# Patient Record
Sex: Male | Born: 1960 | Race: White | Hispanic: No | Marital: Married | Smoking: Former smoker
Health system: Southern US, Community
[De-identification: ages and names within clinical notes are randomized; demographics above are authoritative.]

## PROBLEM LIST (undated history)

## (undated) DIAGNOSIS — K439 Ventral hernia without obstruction or gangrene: Secondary | ICD-10-CM

## (undated) DIAGNOSIS — C689 Malignant neoplasm of urinary organ, unspecified: Secondary | ICD-10-CM

## (undated) DIAGNOSIS — Z8719 Personal history of other diseases of the digestive system: Secondary | ICD-10-CM

## (undated) DIAGNOSIS — R000 Tachycardia, unspecified: Secondary | ICD-10-CM

## (undated) DIAGNOSIS — M1990 Unspecified osteoarthritis, unspecified site: Secondary | ICD-10-CM

## (undated) DIAGNOSIS — N329 Bladder disorder, unspecified: Secondary | ICD-10-CM

## (undated) DIAGNOSIS — K8020 Calculus of gallbladder without cholecystitis without obstruction: Secondary | ICD-10-CM

## (undated) DIAGNOSIS — F419 Anxiety disorder, unspecified: Secondary | ICD-10-CM

## (undated) DIAGNOSIS — C7951 Secondary malignant neoplasm of bone: Secondary | ICD-10-CM

## (undated) DIAGNOSIS — R319 Hematuria, unspecified: Secondary | ICD-10-CM

## (undated) DIAGNOSIS — K5730 Diverticulosis of large intestine without perforation or abscess without bleeding: Secondary | ICD-10-CM

## (undated) DIAGNOSIS — C679 Malignant neoplasm of bladder, unspecified: Secondary | ICD-10-CM

## (undated) DIAGNOSIS — C787 Secondary malignant neoplasm of liver and intrahepatic bile duct: Secondary | ICD-10-CM

## (undated) DIAGNOSIS — E785 Hyperlipidemia, unspecified: Secondary | ICD-10-CM

## (undated) HISTORY — PX: OTHER SURGICAL HISTORY: SHX169

## (undated) HISTORY — PX: APPENDECTOMY: SHX54

---

## 2000-07-02 HISTORY — PX: ABDOMINAL HERNIA REPAIR: SHX539

## 2005-10-27 ENCOUNTER — Emergency Department (HOSPITAL_COMMUNITY): Admission: EM | Admit: 2005-10-27 | Discharge: 2005-10-27 | Payer: Self-pay | Admitting: Emergency Medicine

## 2005-10-30 ENCOUNTER — Inpatient Hospital Stay (HOSPITAL_COMMUNITY): Admission: AD | Admit: 2005-10-30 | Discharge: 2005-11-05 | Payer: Self-pay | Admitting: Family Medicine

## 2005-10-31 ENCOUNTER — Ambulatory Visit: Payer: Self-pay | Admitting: Internal Medicine

## 2005-11-02 ENCOUNTER — Encounter (INDEPENDENT_AMBULATORY_CARE_PROVIDER_SITE_OTHER): Payer: Self-pay | Admitting: Specialist

## 2005-11-02 HISTORY — PX: OTHER SURGICAL HISTORY: SHX169

## 2007-07-21 ENCOUNTER — Ambulatory Visit (HOSPITAL_COMMUNITY): Admission: RE | Admit: 2007-07-21 | Discharge: 2007-07-21 | Payer: Self-pay | Admitting: Family Medicine

## 2007-08-01 ENCOUNTER — Ambulatory Visit (HOSPITAL_COMMUNITY): Admission: RE | Admit: 2007-08-01 | Discharge: 2007-08-01 | Payer: Self-pay | Admitting: Family Medicine

## 2010-11-17 NOTE — Discharge Summary (Signed)
NAME:  [name], [name] NO.:  [id number]   MEDICAL RECORD NO.:  [id number]          PATIENT TYPE:  INP   LOCATION:  A326                          FACILITY:  [facility]   PHYSICIAN:  Dalia Heading, M.D.  DATE OF BIRTH:  Nov 24, 1960   DATE OF ADMISSION:  10/30/2005  DATE OF DISCHARGE:  05/07/2007LH                                 DISCHARGE SUMMARY   HOSPITAL COURSE SUMMARY:  The patient is a 50 year old white male who was  admitted by Dr. Gareth Morgan for worsening abdominal distension, nausea,  vomiting.  A CT scan of the abdomen and pelvis done on in an outside  facility revealed a partial small-bowel obstruction.  Despite nasogastric  tube decompression, the patient's bowel obstruction did not resolve.  Both  gastroenterology and surgery were consulted.  Ultimately, the patient was  taken to the operating room on Nov 02, 2005 and underwent exploratory  laparotomy, lysis of adhesions and a Meckel's diverticulectomy.  He was  found to have previous adhesions encompassing the mid small bowel in the  right lower quadrant from a previous appendectomy.  He also was found to  have an incidental Meckel's diverticulum which was resected.  The patient  tolerated the procedure well.  The patient's postoperative course was for  the most part unremarkable.  The diet was advanced without difficulty once  his bowel function returned.   The patient is being discharged home on postoperative day #3 in fair and  stable condition.   DISCHARGE INSTRUCTIONS:  The patient is to follow up with Dr. Franky Macho  on Nov 12, 2005.   DISCHARGE MEDICATIONS:  Vicodin 1-2 tablets p.o. q.4 h p.r.n. pain.   PRINCIPAL DIAGNOSIS:  1.  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  2.  Meckel's diverticulum.   PRINCIPAL PROCEDURE:  Exploratory laparotomy, lysis of adhesions, Meckel's  diverticulectomy on Nov 02, 2005.      Dalia Heading, M.D.  Electronically Signed     MAJ/MEDQ  D:   11/05/2005  T:  11/06/2005  Job:  [id number]   cc:   Lionel December, M.D.  [address]  [place]  [place] [address]   Mila Homer Sudie Bailey, M.D.  Fax: [phone]

## 2010-11-17 NOTE — Op Note (Signed)
NAME:  ADEM, COSTLOW NO.:  [id number]   MEDICAL RECORD NO.:  [id number]          PATIENT TYPE:  INP   LOCATION:  A326                          FACILITY:  [facility]   PHYSICIAN:  Dalia Heading, M.D.  DATE OF BIRTH:  02-16-1961   DATE OF PROCEDURE:  11/02/2005  DATE OF DISCHARGE:                                 OPERATIVE REPORT   AGE:  50 years.   PREOPERATIVE DIAGNOSIS:  Small bowel obstruction.   POSTOPERATIVE DIAGNOSES:  1.  Small bowel obstruction.  2.  Adhesive disease.  3.  Meckel's diverticulum.   PROCEDURES:  1.  Exploratory laparotomy.  2.  Lysis of adhesions.  3.  Meckel's diverticulectomy.   SURGEON:  Dr. Franky Macho.   ANESTHESIA:  General endotracheal.   INDICATIONS:  The patient is a 50 year old white male who presents with  abdominal distension.  X-rays reveal this to be a small bowel obstruction.  He has not improved with nasogastric tube decompression, thus the patient is  coming to the operating room for an exploratory laparotomy.  The risks and  benefits of the procedure, including bleeding, infection, and  cardiopulmonary difficulties, were fully explained to the patient who gave  informed consent.   PROCEDURE NOTE:  The patient was placed in the supine position.  After  induction of general endotracheal anesthesia, the abdomen was prepped and  draped using the usual sterile technique with Betadine.  Surgical site  confirmation was performed.   A midline incision was made around the umbilicus.  The peritoneal cavity was  entered into without difficulty.  The patient was noted to have thickened,  proximal small bowel wall.  This was also noted to be dilated.  This was  taken down to an area in the right lower quadrant region.  Omentum was noted  to be stuck in this region, causing some of the obstruction.  In addition,  the mesentery in this defect appeared to be somewhat strictured down  secondary to adhesions.  These were freed  away without difficulty.  The  omentum was divided without difficulty.  Distal to this, collapsed small  bowel was noted.  On further inspection towards the terminal ileum, a  Meckel's diverticulum was found.  This was excised using a TA-30 stapler.  The staple line was bolstered using 3-0 silk sutures.  The specimen sent to  pathology for further examination.  The small bowel was then inspected from  the ligament of Treitz to the terminal ileum.  No other obstructive lesions  were noted.  The rest the abdominal examination was within normal limits.  The patient did not have an appendix.  The ascending colon, transverse  colon, descending colon regions were within normal limits.  No abnormal  lesions were noted.  The nasogastric tube was noted be in the appropriate  position in the stomach.  The liver was inspected, noted to within normal  limits.  The bowels were returned into the abdominal cavity in orderly  fashion, after the peritoneal cavity was irrigated normal saline.  The  fascia was reapproximated using looped 0 Novofil  running suture.  Subcutaneous layer was irrigated normal saline and skin was closed using  staples.  Betadine ointment and dry sterile dressing were applied.   All tape and needle counts were correct at the end of the procedure.  The  patient was extubated in the operating room and went back to recovery room,  awake, in stable condition.   COMPLICATIONS:  None.   SPECIMEN:  Meckel's diverticulum.   BLOOD LOSS:  Minimal.      Dalia Heading, M.D.  Electronically Signed     MAJ/MEDQ  D:  11/02/2005  T:  11/04/2005  Job:  [id number]   cc:   Lionel December, M.D.  [address]  [place]  [place] [address]   Mila Homer Sudie Bailey, M.D.  Fax: [phone]   Dalia Heading, M.D.  Fax: [phone]

## 2010-11-17 NOTE — Group Therapy Note (Signed)
NAMEMarland Kitchen  [name], [name] NO.:  [id number]   MEDICAL RECORD NO.:  [id number]          PATIENT TYPE:  INP   LOCATION:  A326                          FACILITY:  [facility]   PHYSICIAN:  Mila Homer. Sudie Bailey, M.D.DATE OF BIRTH:  [birth year]   DATE OF PROCEDURE:  11/02/2005  DATE OF DISCHARGE:                                   PROGRESS NOTE   SUBJECTIVE:  The patient is tried on clear liquids yesterday, but could not  tolerate them. Still having abdominal pain. No real bowel movement to speak  of.   OBJECTIVE:  Temperature 99.3, pulse 69, respiratory rate 20, blood pressure  120/65. She is supine in bed, in no acute distress today except for his NG  tube. His heart has a regular rhythm with a rate of 70. The lungs are clear  throughout, moving air well. Color is good. The abdomen is distended with  some minor discomfort in the epigastrium and periumbilical region. There is  no edema in the ankles.   ASSESSMENT:  Small bowel obstruction.   PLAN:  Discussed with surgeon, Dr. Franky Macho. The patient will have  laparotomy today for presumptive lysis of adhesions.      Mila Homer. Sudie Bailey, M.D.  Electronically Signed     SDK/MEDQ  D:  11/02/2005  T:  11/02/2005  Job:  [id number]

## 2010-11-17 NOTE — Consult Note (Signed)
NAME:  [name], RINKS                ACCOUNT NO.:  [id number]   MEDICAL RECORD NO.:  [id number]          PATIENT TYPE:  INP   LOCATION:  A326                          FACILITY:  [facility]   PHYSICIAN:  Lionel December, M.D.    DATE OF BIRTH:  Apr 25, 1961   DATE OF CONSULTATION:  10/31/2005  DATE OF DISCHARGE:                                   CONSULTATION   REASON FOR CONSULTATION:  Small bowel obstruction.   REQUESTING PHYSICIAN:  Dr. John Giovanni   HISTORY OF PRESENT ILLNESS:  Patient is a 50 year old Caucasian gentleman  patient of Dr. John Giovanni who was admitted for small bowel  obstruction.  He has a six-day history of postprandial abdominal pain  associated with nausea, vomiting.  He had some diarrhea the first two days  and then again yesterday.  Symptoms began several hours after he ate pork  ribs.  This was on Thursday evening.  He states any time he eats now he  develops periumbilical abdominal pain and several hours later will vomit and  then feels better until he eats again.  He had some diarrhea the first two  evenings, but no stool for a couple of days until yesterday evening passed  more liquid stools.  He has had some heartburn.  Denies any chest pain,  shortness of breath, dysuria, hematuria, melena, rectal bleeding,  hematemesis.   Patient presented to the emergency department on October 28, 2005, was sent  home after evaluation.  He followed up with Dr. Sudie Bailey on October 29, 2005  and had a CT of the abdomen and pelvis which revealed multiple dilated small  bowel loops, predominantly the jejunal loops, air fluid loops, and a non-  dilated distal small bowel and colon.  His white count is 10,000, hemoglobin  14.8, potassium 3.4, sodium 132.  LFTs normal.  Amylase and lipase normal.   HOME MEDICATIONS:  1.  Lipitor 40 mg daily.  2.  Alprazolam 1 mg q.i.d.  3.  Atenolol 50 mg daily.   ALLERGIES:  AMOXICILLIN causes diarrhea.   PAST MEDICAL HISTORY:  1.   Hypercholesterolemia.  2.  Anxiety.  3.  History of tachycardia for which he takes atenolol.  Cardiac work-up      apparently unremarkable per patient including Cardiolite stress test and      echocardiogram.   PAST SURGICAL HISTORY:  1.  He had some sort of urinary tract surgery at age 41 with suprapubic      incision.  2.  Appendectomy complicated by postoperative infection requiring second      surgery.  3.  History of ventral hernia repair.   FAMILY HISTORY:  Negative for colorectal cancer.  Has a history of cirrhosis  in the family in several family members who are alcoholics.   SOCIAL HISTORY:  He is married.  Has a son.  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  Smokes one and a fourth packs of  cigarettes daily.  Rarely consumes any alcohol.   REVIEW OF SYSTEMS:  See HPI for GI.  CONSTITUTIONAL:  No weight  loss.  CARDIOPULMONARY:  See HPI.  GU:  See HPI.   PHYSICAL EXAMINATION:  VITAL SIGNS:  Temperature 98.3, pulse 57,  respirations 20, blood pressure 115/73, weight 228.8, height 73 inches.  GENERAL:  Pleasant, well-nourished, well-developed Caucasian male in no  acute distress.  SKIN:  Warm and dry.  No jaundice.  HEENT:  Conjunctivae are pink.  Sclerae non-icteric.  Oropharyngeal mucosa  moist and pink.  No lesions, erythema, or exudate.  No lymphadenopathy,  thyromegaly.  CHEST:  Lungs are clear to auscultation.  CARDIAC:  Regular rate and rhythm.  Normal S1, S2.  No murmurs, rubs, or  gallops.  ABDOMEN:  Occasional bowel sound.  Abdomen is nondistended.  Mild  periumbilical tenderness to deep palpation.  No organomegaly or masses.  No  rebound tenderness or guarding.  No abdominal bruits or hernias.   LABORATORIES:  As mentioned in HPI.  In addition, platelets 216,000.  BUN  13, creatinine 1, total bilirubin 1.5, alkaline phosphatase 81, AST 27, ALT  40, albumin 4.5, amylase 52, lipase 20.   IMPRESSION:  Patient is a 50 year old gentleman  with what appears to be  partial small bowel obstruction possibly due to adhesions.  Clinically today  he does not have any significant bowel sounds but abdomen is very soft and  nondistended.  Abdominal film is pending for today.   RECOMMENDATIONS:  1.  Follow up abdominal film.  2.  Keep n.p.o. until results available.  3.  Further recommendations to follow.      Tana Coast, P.A.      Lionel December, M.D.  Electronically Signed    LL/MEDQ  D:  10/31/2005  T:  10/31/2005  Job:  [id number]

## 2010-11-17 NOTE — Group Therapy Note (Signed)
NAMEMarland [name]  [name], SALING NO.:  [id number]   MEDICAL RECORD NO.:  [id number]          PATIENT TYPE:  INP   LOCATION:  A326                          FACILITY:  [facility]   PHYSICIAN:  Mila Homer. Sudie Bailey, M.D.DATE OF BIRTH:  1960-08-13   DATE OF PROCEDURE:  DATE OF DISCHARGE:                                   PROGRESS NOTE   SUBJECTIVE:  Abdominal pain is doing well this well as long as he is not  eating.   OBJECTIVE:  He is supine in bed, stable with family in attendance.  There is  no acute distress, well developed, well nourished.  Temperature is 98.3,  pulse 57, respiratory rate 20, blood pressure 115/73.  The lungs are clear  throughout.  The heart is a regular rhythm without murmur, rate of 60.  The  abdomen is soft without hepatosplenomegaly or mass, but there is tenderness  on deep palpation near the center abdomen.  Prior admission blood test  showed a MET-7, sodium 132, potassium 3.4, chloride 93, bicarb 33, glucose  111, BUN 13, creatinine 1.0, and the bilirubin was 1.5.   ASSESSMENT:  1.  Viral gastroenteritis with temporary partial paralysis of small bowel      versus small bowel obstruction from adhesions.  2.  Hypokalemia.  3.  Hyperglycemia.   PLAN:  Add KCl 20 mEq per liter to his IV fluids.  Acute abdominal series  due this morning.  We will try him on clear liquids after that.  Discussed  with Dr. Dionicia Abler, gastroenterologist, last night.      Mila Homer. Sudie Bailey, M.D.  Electronically Signed     SDK/MEDQ  D:  10/31/2005  T:  10/31/2005  Job:  [id number]

## 2010-11-17 NOTE — H&P (Signed)
NAME:  [name], [name] NO.:  [id number]   MEDICAL RECORD NO.:  [id number]          PATIENT TYPE:  [facility]   LOCATION:  A326                          FACILITY:  [facility]   PHYSICIAN:  Mila Homer. Sudie Bailey, M.D.DATE OF BIRTH:  March 28, 1961   DATE OF ADMISSION:  10/30/2005  DATE OF DISCHARGE:  LH                                HISTORY & PHYSICAL   HISTORY OF PRESENT ILLNESS:  This 50 year old man was seen at [facility]  Emergency Room 3 days ago after having nausea, vomiting and diarrhea for 2  days.  The diarrhea cleared by the time he was seen in the ER.  He was given  Dilaudid for pain and used some Lorcet Plus.  He was still having fairly  severe pain when I saw him yesterday so arrangements were made for him to  have a CT of the abdomen with contrast, although it is felt the most likely  diagnosis is gastroenteritis of viral cause.   His CT of the abdomen did show findings compatible with proximal small-  bowel obstruction, most likely on the basis of adhesions.  No evidence of  intussusception or intraluminal filling defect.  No other findings.  The  patient went home and threw up more last night and more abdominal pain, day  after last time he threw up he did fine overnight but this morning tried to  eat some toast and had something to drink and he again developed severe mid-  abdominal pain with nausea and vomiting.   He has been generally healthy.  Chronic medical problems include  hypercholesterolemia and tobacco use disorder.  He has been smoking most  recently one and one half packs a day.   He lives in [place].  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   He has had no other symptoms other than abdominal symptoms.   EXAMINATION:  Exam today shows a cooperative middle-age male who actually  walks into the office with his son.  His weight is 235 pounds fully dressed,  the height 73-3/4 inches, the BP 130/84 with a pulse 90, respiratory rate  20,  temperature 98 degrees.  His heart has a regular rhythm without murmur.  The lungs are clear throughout.  The abdomen is soft without demonstrable  hepatosplenomegaly or mass but he does have severe pain to the umbilicus on  deep palpation.  There is no edema of the ankles.   LABORATORIES:  Review of his blood tests when he was seen in the ER, his  sodium was 131, glucose 149, BUN 50 and creatinine 1.0.   ASSESSMENT:  1.  This may be gastroenteritis just with a very sluggish small bowel after      a viral insult but may also be secondary to adhesions.  2.  Tobacco use disorder.  3.  Hypercholesterolemia.  4.  Obesity.   PLAN:  Plan is to admit to the hospital on IV fluids, n.p.o. and have GI see  him first and if they feel it is a surgical problem have surgery see him.  Mila Homer. Sudie Bailey, M.D.  Electronically Signed     SDK/MEDQ  D:  10/30/2005  T:  10/30/2005  Job:  [id number]

## 2013-04-07 ENCOUNTER — Ambulatory Visit (INDEPENDENT_AMBULATORY_CARE_PROVIDER_SITE_OTHER): Payer: Federal, State, Local not specified - PPO | Admitting: Urology

## 2013-04-07 ENCOUNTER — Other Ambulatory Visit: Payer: Self-pay | Admitting: Urology

## 2013-04-07 DIAGNOSIS — R1031 Right lower quadrant pain: Secondary | ICD-10-CM

## 2013-04-07 DIAGNOSIS — R300 Dysuria: Secondary | ICD-10-CM

## 2013-04-16 ENCOUNTER — Encounter (HOSPITAL_COMMUNITY): Payer: Self-pay

## 2013-04-16 ENCOUNTER — Ambulatory Visit (HOSPITAL_COMMUNITY)
Admission: RE | Admit: 2013-04-16 | Discharge: 2013-04-16 | Disposition: A | Discharge status: Home / Self Care | Payer: Federal, State, Local not specified - PPO | Source: Ambulatory Visit | Attending: Urology | Admitting: Urology

## 2013-04-16 DIAGNOSIS — R160 Hepatomegaly, not elsewhere classified: Secondary | ICD-10-CM | POA: Insufficient documentation

## 2013-04-16 DIAGNOSIS — N420 Calculus of prostate: Secondary | ICD-10-CM | POA: Insufficient documentation

## 2013-04-16 DIAGNOSIS — R1031 Right lower quadrant pain: Secondary | ICD-10-CM

## 2013-04-16 DIAGNOSIS — K8020 Calculus of gallbladder without cholecystitis without obstruction: Secondary | ICD-10-CM | POA: Insufficient documentation

## 2013-04-16 DIAGNOSIS — R109 Unspecified abdominal pain: Secondary | ICD-10-CM | POA: Insufficient documentation

## 2013-04-16 DIAGNOSIS — R300 Dysuria: Secondary | ICD-10-CM | POA: Insufficient documentation

## 2013-04-16 DIAGNOSIS — K439 Ventral hernia without obstruction or gangrene: Secondary | ICD-10-CM | POA: Insufficient documentation

## 2013-04-16 DIAGNOSIS — K5730 Diverticulosis of large intestine without perforation or abscess without bleeding: Secondary | ICD-10-CM | POA: Insufficient documentation

## 2013-04-23 NOTE — H&P (Signed)
NTS SOAP Note  Vital Signs:  Vitals as of: 04/23/2013: Systolic 121: Diastolic 69: Heart Rate 66: Temp 98.71F: Height 63ft 1in: Weight 233Lbs 0 Ounces: BMI 30.74  BMI : 30.74 kg/m2  Subjective: This 52 Years 74 Months old Male presents for need for screening colonoscopy.  Also complains of a possible recurrent hernia.  Never has had a colonoscopy.  No family h/o colon cancer.  Denies any gi complaints.  Swelling in abdomen does not bother him at this time.   Review of Symptoms:  Constitutional:unremarkable   Head:unremarkable    Eyes:unremarkable   sinus problems Cardiovascular:  unremarkable   Respiratory:  cough Gastrointestinal:  unremarkable   Genitourinary:    dysuria,frequency   joint, back, and neck pain Skin:unremarkable Hematolgic/Lymphatic:unremarkable       hay fever   Past Medical History:    Reviewed   Past Medical History  Surgical History: exploratory lap, incisional herniorrhaphy with mesh, appendectomy Medical Problems: HTN, arthritis Allergies: nkda Medications: atenolol, orencin, methotrexate    Social History  Preferred Language: English Race:  White Ethnicity: Not Hispanic / Latino Age: 52 Years 6 Months Marital Status:  M Alcohol: rarely Recreational drug(s):  No   Smoking Status: Current every day smoker reviewed on 04/23/2013 Started Date: 07/02/1978 Packs per day: 1.00 Functional Status reviewed on mm/dd/yyyy ------------------------------------------------ Bathing: Normal Cooking: Normal Dressing: Normal Driving: Normal Eating: Normal Managing Meds: Normal Oral Care: Normal Shopping: Normal Toileting: Normal Transferring: Normal Walking: Normal Cognitive Status reviewed on mm/dd/yyyy ------------------------------------------------ Attention: Normal Decision Making: Normal Language: Normal Memory: Normal Motor: Normal Perception: Normal Problem Solving: Normal Visual and Spatial:  Normal   Family History:  Reviewed  Family Health History Mother, Deceased; Esophageal cancer;  Father, Deceased; History Unknown    Objective Information: General:  Well appearing, well nourished in no distress. Heart:  RRR, no murmur Lungs:    CTA bilaterally, no wheezes, rhonchi, rales.  Breathing unlabored. Abdomen:Soft, NT/ND, no HSM, no masses.  Reducble hernia just to right of surgical scar, umbilcal level.bdomen Normal   deferred to procedure  Assessment:Need for screening TCS, recurrent incisional hernia, asymptomatic  Diagnosis &amp; Procedure Smart Code   Plan:Scheduled for TCS on 05/12/13.   Patient Education:Alternative treatments to surgery were discussed with patient (and family).  Risks and benefits  of procedure including bleeding and perforation were fully explained to the patient (and family) who gave informed consent. Patient/family questions were addressed.  Follow-up:Pending Surgery

## 2013-04-27 ENCOUNTER — Encounter (HOSPITAL_COMMUNITY): Payer: Self-pay | Admitting: Pharmacy Technician

## 2013-05-12 ENCOUNTER — Encounter (HOSPITAL_COMMUNITY): Payer: Self-pay | Admitting: *Deleted

## 2013-05-12 ENCOUNTER — Encounter (HOSPITAL_COMMUNITY)
Admission: RE | Disposition: A | Discharge status: Home / Self Care | Payer: Self-pay | Source: Ambulatory Visit | Attending: General Surgery

## 2013-05-12 ENCOUNTER — Ambulatory Visit (HOSPITAL_COMMUNITY)
Admission: RE | Admit: 2013-05-12 | Discharge: 2013-05-12 | Disposition: A | Discharge status: Home / Self Care | Payer: Federal, State, Local not specified - PPO | Source: Ambulatory Visit | Attending: General Surgery | Admitting: General Surgery

## 2013-05-12 DIAGNOSIS — Z1211 Encounter for screening for malignant neoplasm of colon: Secondary | ICD-10-CM | POA: Insufficient documentation

## 2013-05-12 DIAGNOSIS — D126 Benign neoplasm of colon, unspecified: Secondary | ICD-10-CM | POA: Insufficient documentation

## 2013-05-12 DIAGNOSIS — I10 Essential (primary) hypertension: Secondary | ICD-10-CM | POA: Insufficient documentation

## 2013-05-12 HISTORY — DX: Unspecified osteoarthritis, unspecified site: M19.90

## 2013-05-12 HISTORY — PX: COLONOSCOPY: SHX5424

## 2013-05-12 SURGERY — COLONOSCOPY
Anesthesia: Moderate Sedation

## 2013-05-12 MED ORDER — STERILE WATER FOR IRRIGATION IR SOLN
Status: DC | PRN
  Administered 2013-05-12: 09:00:00

## 2013-05-12 MED ORDER — SODIUM CHLORIDE 0.9 % IV SOLN
INTRAVENOUS | Status: DC
  Administered 2013-05-12: 1000 mL via INTRAVENOUS

## 2013-05-12 MED ORDER — MIDAZOLAM HCL 5 MG/5ML IJ SOLN
INTRAMUSCULAR | Status: AC
  Filled 2013-05-12: qty 5

## 2013-05-12 MED ORDER — MIDAZOLAM HCL 5 MG/5ML IJ SOLN
INTRAMUSCULAR | Status: DC | PRN
  Administered 2013-05-12: 4 mg via INTRAVENOUS
  Administered 2013-05-12: 1 mg via INTRAVENOUS

## 2013-05-12 MED ORDER — MEPERIDINE HCL 50 MG/ML IJ SOLN
INTRAMUSCULAR | Status: AC
  Filled 2013-05-12: qty 1

## 2013-05-12 MED ORDER — MEPERIDINE HCL 50 MG/ML IJ SOLN
INTRAMUSCULAR | Status: DC | PRN
  Administered 2013-05-12: 50 mg via INTRAVENOUS

## 2013-05-12 NOTE — Interval H&P Note (Signed)
History and Physical Interval Note:  05/12/2013 8:31 AM  [name]  has presented today for surgery, with the diagnosis of screening  The various methods of treatment have been discussed with the patient and family. After consideration of risks, benefits and other options for treatment, the patient has consented to  Procedure(s): COLONOSCOPY (N/A) as a surgical intervention .  The patient's history has been reviewed, patient examined, no change in status, stable for surgery.  I have reviewed the patient's chart and labs.  Questions were answered to the patient's satisfaction.     Franky Macho A

## 2013-05-12 NOTE — Op Note (Signed)
[facility] [address] [place], [address]   COLONOSCOPY PROCEDURE REPORT  PATIENT: [name], [name]  MR#: [id number] BIRTHDATE: Apr 28, 1961 , 52  yrs. old GENDER: Male ENDOSCOPIST: Franky Macho, MD REFERRED GN:[id number], Brett Canales PROCEDURE DATE:  05/12/2013 PROCEDURE:   Colonoscopy with snare polypectomy ASA CLASS:   Class II INDICATIONS:Average risk patient for colon cancer. MEDICATIONS: Versed 5 mg IV and Demerol 50 mg IV  DESCRIPTION OF PROCEDURE:   After the risks benefits and alternatives of the procedure were thoroughly explained, informed consent was obtained.  A digital rectal exam revealed no abnormalities of the rectum.   The EC-[id number] ([id number])  endoscope was introduced through the anus and advanced to the cecum, which was identified by both the appendix and ileocecal valve. No adverse events experienced.   The quality of the prep was adequate, using MoviPrep  The instrument was then slowly withdrawn as the colon was fully examined.      COLON FINDINGS: A polypoid shaped sessile polyp ranging between 3-25mm in size was found in the distal transverse colon.  A polypectomy was performed using snare cautery.  The resection was complete and the polyp tissue was completely retrieved.   The colon mucosa was otherwise normal.  Retroflexed views revealed no abnormalities. The time to cecum=4 minutes 0 seconds.  Withdrawal time=8 minutes 0 seconds.  The scope was withdrawn and the procedure completed. COMPLICATIONS: There were no complications.  ENDOSCOPIC IMPRESSION: 1.   Sessile polyp ranging between 3-40mm in size was found in the distal transverse colon; polypectomy was performed using snare cautery 2.   The colon mucosa was otherwise normal  RECOMMENDATIONS: 1.  Await pathology results 2.  Repeat Colonoscopy in 3 years.   eSigned:  Franky Macho, MD 05/12/2013 8:55 AM   cc:

## 2013-05-14 ENCOUNTER — Encounter (HOSPITAL_COMMUNITY): Payer: Self-pay | Admitting: General Surgery

## 2013-08-24 ENCOUNTER — Other Ambulatory Visit: Payer: Self-pay | Admitting: Urology

## 2013-08-25 ENCOUNTER — Encounter (HOSPITAL_BASED_OUTPATIENT_CLINIC_OR_DEPARTMENT_OTHER): Payer: Self-pay | Admitting: *Deleted

## 2013-08-26 ENCOUNTER — Encounter (HOSPITAL_BASED_OUTPATIENT_CLINIC_OR_DEPARTMENT_OTHER): Payer: Self-pay | Admitting: *Deleted

## 2013-08-27 ENCOUNTER — Encounter (HOSPITAL_BASED_OUTPATIENT_CLINIC_OR_DEPARTMENT_OTHER): Payer: Self-pay | Admitting: *Deleted

## 2013-08-27 NOTE — Progress Notes (Signed)
NPO AFTER MN. ARRIVE AT 0630. NEEDS HG AND EKG. WILL TAKE ATENOLOL AM DOS W/ SIPS OF WATER.

## 2013-08-28 NOTE — Anesthesia Preprocedure Evaluation (Addendum)
Anesthesia Evaluation  Patient identified by MRN, date of birth, ID band Patient awake    Reviewed: Allergy & Precautions, H&P , NPO status , Patient's Chart, lab work & pertinent test results, reviewed documented beta blocker date and time   Airway Mallampati: II TM Distance: >3 FB Neck ROM: Full    Dental  (+) Dental Advisory Given   Pulmonary Current Smoker,  breath sounds clear to auscultation        Cardiovascular Pt. on home beta blockers Rhythm:Regular Rate:Normal     Neuro/Psych negative neurological ROS  negative psych ROS   GI/Hepatic negative GI ROS, Neg liver ROS,   Endo/Other  negative endocrine ROS  Renal/GU negative Renal ROS     Musculoskeletal  (+) Arthritis -, Rheumatoid disorders,    Abdominal   Peds  Hematology negative hematology ROS (+)   Anesthesia Other Findings   Reproductive/Obstetrics negative OB ROS                         Anesthesia Physical Anesthesia Plan  ASA: II  Anesthesia Plan: General   Post-op Pain Management:    Induction: Intravenous  Airway Management Planned: LMA  Additional Equipment:   Intra-op Plan:   Post-operative Plan: Extubation in OR  Informed Consent: I have reviewed the patients History and Physical, chart, labs and discussed the procedure including the risks, benefits and alternatives for the proposed anesthesia with the patient or authorized representative who has indicated his/her understanding and acceptance.   Dental advisory given  Plan Discussed with: CRNA  Anesthesia Plan Comments: (Discussed risks of anesthesia. Pt is adamant that he is asymptomatic and has very good exercise tolerance. Denies chest pain, SOB, DOE, orthopnea, nausea. He said that he wished to proceed. I said that I could not guarantee that he wasn't having or at risk for having cardiac issues. I counseled him to see his PCP ASAP to discuss EKG findings  and possible cardiac W/U.  Given his EKG findings in setting of rheumatoid arthritis he could have some pericarditis as well.)       Anesthesia Quick Evaluation

## 2013-08-30 NOTE — H&P (Signed)
Urology History and Physical Exam  CC: Hematuria, dysuria  HPI: 53 year old male presents for cystoscopy, bilateral retrogrades and a bladder biopsy. He has persistent dysuria and on cystoscopy was found to have a bladder lesion adjacent to the left ureteral orifice. Hematuria CT revealed no upper tract lesions.  PMH: Past Medical History  Diagnosis Date  . Lesion of bladder   . Hematuria   . RA (rheumatoid arthritis)   . Tachycardia     PT UNSURE TYPE--  CONTROLLED W/ ATENOLOL (ALSO STATES HAD NORMAL STRESS TEST )  . Hyperlipidemia   . History of small bowel obstruction     2007 W/ SURGICAL INTERVENTION  . Sigmoid diverticulum   . Asymptomatic cholelithiasis   . Hernia, ventral     INCISIONAL    PSH: Past Surgical History  Procedure Laterality Date  . Colonoscopy N/A 05/12/2013    Procedure: COLONOSCOPY;  Surgeon: Jamesetta So, MD;  Location: [facility];  Service: Gastroenterology;  Laterality: N/A;  . Exploratory laparotomy/  lysis adhesions/  meckel's diverticulectomy  11-02-2005    SBO  . Urinary tract surgery with suprapubic approach  AGE 46  . Abdominal hernia repair  2002  . Appendectomy  AGE 44  . Cysto/  urethral dilation  AGE 44    Allergies: Allergies  Allergen Reactions  . Enbrel [Etanercept] Rash    Medications: No prescriptions prior to admission     Social History: History   Social History  . Marital Status: Married    Spouse Name: N/A    Number of Children: N/A  . Years of Education: N/A   Occupational History  . Not on file.   Social History Main Topics  . Smoking status: Former Smoker -- 1.00 packs/day for 30 years    Types: Cigarettes    Quit date: 08/27/2005  . Smokeless tobacco: Never Used  . Alcohol Use: Yes     Comment: social  . Drug Use: No  . Sexual Activity: Not on file   Other Topics Concern  . Not on file   Social History Narrative  . No narrative on file    Family History: History reviewed. No pertinent  family history.  Review of Systems: Genitourinary, constitutional, skin, eye, otolaryngeal, hematologic/lymphatic, cardiovascular, pulmonary, endocrine, musculoskeletal, gastrointestinal, neurological and psychiatric system(s) were reviewed and pertinent findings if present are noted.  Genitourinary: urinary frequency, feelings of urinary urgency and dysuria, but no hematuria.  Gastrointestinal: no abdominal pain.  ENT: sinus problems.  Respiratory: cough.  Musculoskeletal: back pain and joint pain.      Physical Exam: @VITALS2 @ Constitutional: Well nourished and well developed . No acute distress.  ENT:. The ears and nose are normal in appearance.  Neck: The appearance of the neck is normal and no neck mass is present.  Pulmonary: No respiratory distress and normal respiratory rhythm and effort.  Abdomen: The abdomen is soft and nontender. No masses are palpated. No CVA tenderness. No hernias are palpable. No hepatosplenomegaly noted.  Rectal: Rectal exam demonstrates normal sphincter tone, the anus is normal on inspection., no tenderness and no masses. Estimated prostate size is 1+. Normal rectal tone, no rectal masses, prostate is smooth, symmetric and non-tender. The prostate has no nodularity and is not tender. The left seminal vesicle is nonpalpable. The right seminal vesicle is nonpalpable. The perineum is normal on inspection.  Genitourinary: Examination of the penis demonstrates no discharge, no masses, no lesions and a normal meatus. The penis is circumcised. The scrotum  is normal in appearance and without lesions. The right epididymis is palpably normal and non-tender. The left epididymis is palpably normal and non-tender. The right testis is palpably normal, non-tender and without masses. The left testis is normal, non-tender and without masses.   Studies:  No results found for this basename: HGB, WBC, PLT,  in the last 72 hours  No results found for this basename: NA, K, CL,  CO2, BUN, CREATININE, CALCIUM, MAGNESIUM, GFRNONAA, GFRAA,  in the last 72 hours   No results found for this basename: PT, INR, APTT,  in the last 72 hours   No components found with this basename: ABG,     Assessment:   1. Persistent dysuria with microscopic hematuria. He has normal upper tracts. On cystoscopy today, he does have erythema and inflammatory changes around his left ureteral orifice/at the base of his bladder on the left side. This may well be an undetected distal ureteral stone versus a bladder tumor 2. History of prior urologic surgery as a young boy. He may have had a reimplant. I did not easily see his right ureteral orifice today 3. Bladder lesion   Plan: 1. I will give him a short course of Cipro 2. He was also given Uribel for his discomfort 3. I would suggest we proceed with cystoscopy, bilateral retrograde pyelogram, possible ureteroscopy, bladder biopsy. This can be performed as an outpatient, but I did let him know that he may have to go home with a catheter

## 2013-08-31 ENCOUNTER — Encounter (HOSPITAL_BASED_OUTPATIENT_CLINIC_OR_DEPARTMENT_OTHER): Payer: Federal, State, Local not specified - PPO | Admitting: Anesthesiology

## 2013-08-31 ENCOUNTER — Ambulatory Visit (HOSPITAL_BASED_OUTPATIENT_CLINIC_OR_DEPARTMENT_OTHER): Payer: Federal, State, Local not specified - PPO | Admitting: Anesthesiology

## 2013-08-31 ENCOUNTER — Encounter (HOSPITAL_BASED_OUTPATIENT_CLINIC_OR_DEPARTMENT_OTHER)
Admission: RE | Disposition: A | Discharge status: Home / Self Care | Payer: Self-pay | Source: Ambulatory Visit | Attending: Urology

## 2013-08-31 ENCOUNTER — Other Ambulatory Visit: Payer: Self-pay

## 2013-08-31 ENCOUNTER — Ambulatory Visit (HOSPITAL_BASED_OUTPATIENT_CLINIC_OR_DEPARTMENT_OTHER)
Admission: RE | Admit: 2013-08-31 | Discharge: 2013-08-31 | Disposition: A | Discharge status: Home / Self Care | Payer: Federal, State, Local not specified - PPO | Source: Ambulatory Visit | Attending: Urology | Admitting: Urology

## 2013-08-31 ENCOUNTER — Encounter (HOSPITAL_BASED_OUTPATIENT_CLINIC_OR_DEPARTMENT_OTHER): Payer: Self-pay | Admitting: *Deleted

## 2013-08-31 DIAGNOSIS — N3090 Cystitis, unspecified without hematuria: Secondary | ICD-10-CM | POA: Insufficient documentation

## 2013-08-31 DIAGNOSIS — Z87891 Personal history of nicotine dependence: Secondary | ICD-10-CM | POA: Insufficient documentation

## 2013-08-31 DIAGNOSIS — R310 Gross hematuria: Secondary | ICD-10-CM

## 2013-08-31 DIAGNOSIS — E785 Hyperlipidemia, unspecified: Secondary | ICD-10-CM | POA: Insufficient documentation

## 2013-08-31 DIAGNOSIS — Z9089 Acquired absence of other organs: Secondary | ICD-10-CM | POA: Insufficient documentation

## 2013-08-31 DIAGNOSIS — C675 Malignant neoplasm of bladder neck: Secondary | ICD-10-CM | POA: Insufficient documentation

## 2013-08-31 DIAGNOSIS — M069 Rheumatoid arthritis, unspecified: Secondary | ICD-10-CM | POA: Insufficient documentation

## 2013-08-31 DIAGNOSIS — N3080 Other cystitis without hematuria: Secondary | ICD-10-CM | POA: Insufficient documentation

## 2013-08-31 HISTORY — DX: Diverticulosis of large intestine without perforation or abscess without bleeding: K57.30

## 2013-08-31 HISTORY — DX: Ventral hernia without obstruction or gangrene: K43.9

## 2013-08-31 HISTORY — DX: Calculus of gallbladder without cholecystitis without obstruction: K80.20

## 2013-08-31 HISTORY — DX: Hyperlipidemia, unspecified: E78.5

## 2013-08-31 HISTORY — DX: Personal history of other diseases of the digestive system: Z87.19

## 2013-08-31 HISTORY — DX: Tachycardia, unspecified: R00.0

## 2013-08-31 HISTORY — PX: CYSTOSCOPY/RETROGRADE/URETEROSCOPY: SHX5316

## 2013-08-31 HISTORY — DX: Hematuria, unspecified: R31.9

## 2013-08-31 HISTORY — DX: Bladder disorder, unspecified: N32.9

## 2013-08-31 LAB — POCT HEMOGLOBIN-HEMACUE: Hemoglobin: 17.6 g/dL — ABNORMAL HIGH (ref 13.0–17.0)

## 2013-08-31 SURGERY — CYSTOSCOPY/RETROGRADE/URETEROSCOPY
Anesthesia: General | Site: Bladder

## 2013-08-31 MED ORDER — MIDAZOLAM HCL 5 MG/5ML IJ SOLN
INTRAMUSCULAR | Status: DC | PRN
  Administered 2013-08-31: 2 mg via INTRAVENOUS

## 2013-08-31 MED ORDER — PROMETHAZINE HCL 25 MG/ML IJ SOLN
6.2500 mg | INTRAMUSCULAR | Status: DC | PRN
  Filled 2013-08-31: qty 1

## 2013-08-31 MED ORDER — FENTANYL CITRATE 0.05 MG/ML IJ SOLN
INTRAMUSCULAR | Status: DC | PRN
  Administered 2013-08-31 (×2): 12.5 ug via INTRAVENOUS
  Administered 2013-08-31: 25 ug via INTRAVENOUS
  Administered 2013-08-31 (×2): 12.5 ug via INTRAVENOUS
  Administered 2013-08-31: 50 ug via INTRAVENOUS
  Administered 2013-08-31 (×2): 12.5 ug via INTRAVENOUS
  Administered 2013-08-31 (×2): 25 ug via INTRAVENOUS

## 2013-08-31 MED ORDER — CEFAZOLIN SODIUM-DEXTROSE 2-3 GM-% IV SOLR
INTRAVENOUS | Status: DC | PRN
  Administered 2013-08-31: 2 g via INTRAVENOUS

## 2013-08-31 MED ORDER — PROPOFOL 10 MG/ML IV BOLUS
INTRAVENOUS | Status: DC | PRN
  Administered 2013-08-31: 250 mg via INTRAVENOUS

## 2013-08-31 MED ORDER — GLYCOPYRROLATE 0.2 MG/ML IJ SOLN
INTRAMUSCULAR | Status: DC | PRN
  Administered 2013-08-31: 0.2 mg via INTRAVENOUS

## 2013-08-31 MED ORDER — IOHEXOL 350 MG/ML SOLN
INTRAVENOUS | Status: DC | PRN
  Administered 2013-08-31: 21 mL

## 2013-08-31 MED ORDER — SODIUM CHLORIDE 0.9 % IJ SOLN
3.0000 mL | INTRAMUSCULAR | Status: DC | PRN
  Filled 2013-08-31: qty 3

## 2013-08-31 MED ORDER — CEFAZOLIN SODIUM-DEXTROSE 2-3 GM-% IV SOLR
2.0000 g | INTRAVENOUS | Status: DC
  Filled 2013-08-31: qty 50

## 2013-08-31 MED ORDER — LACTATED RINGERS IV SOLN
INTRAVENOUS | Status: DC | PRN
  Administered 2013-08-31 (×2): via INTRAVENOUS

## 2013-08-31 MED ORDER — MIDAZOLAM HCL 2 MG/2ML IJ SOLN
INTRAMUSCULAR | Status: AC
  Filled 2013-08-31: qty 2

## 2013-08-31 MED ORDER — HYDROMORPHONE HCL PF 1 MG/ML IJ SOLN
0.2500 mg | INTRAMUSCULAR | Status: DC | PRN
  Filled 2013-08-31: qty 1

## 2013-08-31 MED ORDER — OXYCODONE HCL 5 MG PO TABS
5.0000 mg | ORAL_TABLET | ORAL | Status: DC | PRN
  Filled 2013-08-31: qty 2

## 2013-08-31 MED ORDER — SODIUM CHLORIDE 0.9 % IV SOLN
250.0000 mL | INTRAVENOUS | Status: DC | PRN
  Filled 2013-08-31: qty 250

## 2013-08-31 MED ORDER — ACETAMINOPHEN 325 MG PO TABS
650.0000 mg | ORAL_TABLET | ORAL | Status: DC | PRN
  Filled 2013-08-31: qty 2

## 2013-08-31 MED ORDER — LACTATED RINGERS IV SOLN
INTRAVENOUS | Status: DC
  Administered 2013-08-31: 07:00:00 via INTRAVENOUS
  Filled 2013-08-31: qty 1000

## 2013-08-31 MED ORDER — FENTANYL CITRATE 0.05 MG/ML IJ SOLN
INTRAMUSCULAR | Status: AC
  Filled 2013-08-31: qty 4

## 2013-08-31 MED ORDER — HYDROCODONE-ACETAMINOPHEN 5-325 MG PO TABS
1.0000 | ORAL_TABLET | ORAL | Status: DC | PRN
  Filled 2013-08-31: qty 2

## 2013-08-31 MED ORDER — DEXAMETHASONE SODIUM PHOSPHATE 4 MG/ML IJ SOLN
INTRAMUSCULAR | Status: DC | PRN
  Administered 2013-08-31: 10 mg via INTRAVENOUS

## 2013-08-31 MED ORDER — SODIUM CHLORIDE 0.9 % IJ SOLN
3.0000 mL | Freq: Two times a day (BID) | INTRAMUSCULAR | Status: DC
  Filled 2013-08-31: qty 3

## 2013-08-31 MED ORDER — STERILE WATER FOR IRRIGATION IR SOLN
Status: DC | PRN
  Administered 2013-08-31: 500 mL

## 2013-08-31 MED ORDER — MITOMYCIN CHEMO FOR BLADDER INSTILLATION 40 MG
40.0000 mg | Freq: Once | INTRAVENOUS | Status: AC
  Administered 2013-08-31: 40 mg via INTRAVESICAL
  Filled 2013-08-31 (×2): qty 40

## 2013-08-31 MED ORDER — ONDANSETRON HCL 4 MG/2ML IJ SOLN
INTRAMUSCULAR | Status: DC | PRN
  Administered 2013-08-31: 4 mg via INTRAVENOUS

## 2013-08-31 MED ORDER — ACETAMINOPHEN 650 MG RE SUPP
650.0000 mg | RECTAL | Status: DC | PRN
  Filled 2013-08-31: qty 1

## 2013-08-31 MED ORDER — MEPERIDINE HCL 25 MG/ML IJ SOLN
6.2500 mg | INTRAMUSCULAR | Status: DC | PRN
  Filled 2013-08-31: qty 1

## 2013-08-31 MED ORDER — ONDANSETRON HCL 4 MG/2ML IJ SOLN
4.0000 mg | Freq: Four times a day (QID) | INTRAMUSCULAR | Status: DC | PRN
  Filled 2013-08-31: qty 2

## 2013-08-31 MED ORDER — CEFAZOLIN SODIUM 1-5 GM-% IV SOLN
1.0000 g | INTRAVENOUS | Status: DC
  Filled 2013-08-31: qty 50

## 2013-08-31 MED ORDER — BELLADONNA ALKALOIDS-OPIUM 16.2-60 MG RE SUPP
RECTAL | Status: AC
  Filled 2013-08-31: qty 1

## 2013-08-31 MED ORDER — STERILE WATER FOR IRRIGATION IR SOLN
Status: DC | PRN
  Administered 2013-08-31: 3000 mL via INTRAVESICAL

## 2013-08-31 MED ORDER — ACETAMINOPHEN 10 MG/ML IV SOLN
INTRAVENOUS | Status: DC | PRN
  Administered 2013-08-31: 1000 mg via INTRAVENOUS

## 2013-08-31 MED ORDER — LIDOCAINE HCL (CARDIAC) 20 MG/ML IV SOLN
INTRAVENOUS | Status: DC | PRN
  Administered 2013-08-31: 100 mg via INTRAVENOUS

## 2013-08-31 MED ORDER — OXYCODONE HCL 5 MG/5ML PO SOLN
5.0000 mg | Freq: Once | ORAL | Status: DC | PRN
  Filled 2013-08-31: qty 5

## 2013-08-31 MED ORDER — HYDROCODONE-ACETAMINOPHEN 5-325 MG PO TABS: 1.0000 | ORAL_TABLET | ORAL | Status: DC | PRN

## 2013-08-31 MED ORDER — OXYCODONE HCL 5 MG PO TABS
5.0000 mg | ORAL_TABLET | Freq: Once | ORAL | Status: DC | PRN
  Filled 2013-08-31: qty 1

## 2013-08-31 MED ORDER — SODIUM CHLORIDE 0.9 % IR SOLN
Status: DC | PRN
  Administered 2013-08-31: 2000 mL

## 2013-08-31 SURGICAL SUPPLY — 33 items
ADAPTER CATH URET PLST 4-6FR (CATHETERS) IMPLANT
ADPR CATH URET STRL DISP 4-6FR (CATHETERS)
BAG DRAIN URO-CYSTO SKYTR STRL (DRAIN) ×3 IMPLANT
BAG DRN UROCATH (DRAIN) ×1
BASKET ZERO TIP NITINOL 2.4FR (BASKET) IMPLANT
BSKT STON RTRVL ZERO TP 2.4FR (BASKET)
CANISTER SUCT LVC 12 LTR MEDI- (MISCELLANEOUS) ×2 IMPLANT
CATH FOLEY 2WAY SLVR  5CC 20FR (CATHETERS) ×2
CATH FOLEY 2WAY SLVR 5CC 20FR (CATHETERS) IMPLANT
CATH INTERMIT  6FR 70CM (CATHETERS) IMPLANT
CLOTH BEACON ORANGE TIMEOUT ST (SAFETY) ×3 IMPLANT
DRAPE CAMERA CLOSED 9X96 (DRAPES) ×3 IMPLANT
FIBER LASER FLEXIVA 200 (UROLOGICAL SUPPLIES) IMPLANT
FIBER LASER FLEXIVA 365 (UROLOGICAL SUPPLIES) IMPLANT
GLOVE BIO SURGEON STRL SZ 6.5 (GLOVE) ×1 IMPLANT
GLOVE BIO SURGEON STRL SZ8 (GLOVE) ×3 IMPLANT
GLOVE BIO SURGEONS STRL SZ 6.5 (GLOVE) ×1
GLOVE INDICATOR 7.0 STRL GRN (GLOVE) ×6 IMPLANT
GOWN PREVENTION PLUS LG XLONG (DISPOSABLE) ×3 IMPLANT
GOWN STRL REIN XL XLG (GOWN DISPOSABLE) ×3 IMPLANT
GOWN STRL REUS W/ TWL LRG LVL3 (GOWN DISPOSABLE) IMPLANT
GOWN STRL REUS W/ TWL XL LVL3 (GOWN DISPOSABLE) IMPLANT
GOWN STRL REUS W/TWL LRG LVL3 (GOWN DISPOSABLE) ×3
GOWN STRL REUS W/TWL XL LVL3 (GOWN DISPOSABLE) ×3
GUIDEWIRE 0.038 PTFE COATED (WIRE) IMPLANT
GUIDEWIRE ANG ZIPWIRE 038X150 (WIRE) IMPLANT
GUIDEWIRE STR DUAL SENSOR (WIRE) ×2 IMPLANT
IV NS 1000ML (IV SOLUTION) ×6
IV NS 1000ML BAXH (IV SOLUTION) IMPLANT
IV NS IRRIG 3000ML ARTHROMATIC (IV SOLUTION) ×2 IMPLANT
NS IRRIG 500ML POUR BTL (IV SOLUTION) IMPLANT
PACK CYSTOSCOPY (CUSTOM PROCEDURE TRAY) ×3 IMPLANT
PLUG CATH AND CAP STER (CATHETERS) ×2 IMPLANT

## 2013-08-31 NOTE — Discharge Instructions (Signed)
1. You may see some blood in the urine and may have some burning with urination for 48-72 hours. You also may notice that you have to urinate more frequently or urgently after your procedure which is normal.  2. You should call should you develop an inability urinate, fever > 101, persistent nausea and vomiting that prevents you from eating or drinking to stay hydrated.  If you have a catheter, you will be taught how to take care of the catheter by the nursing staff prior to discharge from the hospital.  You may periodically feel a strong urge to void with the catheter in place.  This is a bladder spasm and most often can occur when having a bowel movement or moving around. It is typically self-limited and usually will stop after a few minutes.  You may use some Vaseline or Neosporin around the tip of the catheter to reduce friction at the tip of the penis. You may also see some blood in the urine.  A very small amount of blood can make the urine look quite red.  As long as the catheter is draining well, there usually is not a problem.  Remove the catheter as instructed on Tuesday morning, unless there are significant clots in the urine. However, if the catheter is not draining well and is bloody, you should call the office [phone]) to notify us. Post Anesthesia Home Care Instructions  Activity: Get plenty of rest for the remainder of the day. A responsible adult should stay with you for 24 hours following the procedure.  For the next 24 hours, DO NOT: -Drive a car -Paediatric nurse -Drink alcoholic beverages -Take any medication unless instructed by your physician -Make any legal decisions or sign important papers.  Meals: Start with liquid foods such as gelatin or soup. Progress to regular foods as tolerated. Avoid greasy, spicy, heavy foods. If nausea and/or vomiting occur, drink only clear liquids until the nausea and/or vomiting subsides. Call your physician if vomiting  continues.  Special Instructions/Symptoms: Your throat may feel dry or sore from the anesthesia or the breathing tube placed in your throat during surgery. If this causes discomfort, gargle with warm salt water. The discomfort should disappear within 24 hours. 3.

## 2013-08-31 NOTE — Op Note (Signed)
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 Yaneliz Radebaugh, M.D.  ANESTHESIA:  General  EBL:  Minimal  DRAINS: None  LOCAL MEDICATIONS USED:  None  SPECIMEN:  1. Biopsies of trigone 2. Left bladder neck lesion  INDICATION: [name] is a 53 year old smoker with dysuria, microscopic hematuria and and normal upper tract evaluation/hematuria CT. Cystoscopy recently revealed the patient have a lesion approximating the left ureteral orifice. With his history of smoking and dysuria, he presents at this time for corrective grades and biopsy of this lesion. Risks and complications of the procedure have been discussed with him. He understands these and desires to proceed.  Description of procedure: The patient was properly identified and marked (if applicable) in the holding area. They were then  taken to the operating room and placed on the table in a supine position. General anesthesia was then administered. Once fully anesthetized the patient was moved to the dorsolithotomy position and the genitalia and perineum were sterilely prepped and draped in standard fashion. An official timeout was then performed.  A rigid 45 French panendoscope was passed under direct vision through his urethra. There were no strictures. Prostatic urethra was unremarkable, there was no obstruction of the prostate. The bladder was entered and inspected circumferentially, with both the 12 and 70 lenses. Both ureteral orifices were normal in configuration and location. There were no trabeculations or foreign bodies. There was mild erythema with raised urothelium in the trigone. The left bladder neck, right low down, there was a papillary/raised  lesion. This was approximately 1.2-1.5 cm in size. No other bladder neck lesions were seen.  At this point I placed cold cup biopsy forceps, and took approximately 4 biopsies from the bladder neck/trigonal region. These were sent as "trigonal biopsies". This area was cauterized with the loop which was placed on the resectoscope, which was replaced after the cystoscope was removed, following dilatation of the urethra with a [place] sound. I inspected the bladder neck, and at this point resected the lesion at the left bladder neck, deep into muscle. I labeled this as "left bladder neck lesion". The biopsy site was cauterized, and at this point the trigone was inspected and found to be hemostatic as well. At this point, the bladder was drained. I placed a 5 French Foley catheter and filled the balloon with 10 cc of water. I put a plug in the catheter. At this point the patient was awakened and taken to the PACU in stable condition. He tolerated the procedure well.  Following the procedure discussing both with the wife and the patient, I placed mitomycin, 40 mg, in the bladder which was instilled for one hour. The patient will be discharged with a catheter to dependent drainage.    PLAN OF CARE: Discharge to home after PACU  PATIENT DISPOSITION:  PACU - hemodynamically stable.

## 2013-08-31 NOTE — Anesthesia Procedure Notes (Signed)
Procedure Name: LMA Insertion Date/Time: 08/31/2013 8:07 AM Performed by: Justice Rocher Pre-anesthesia Checklist: Patient identified, Emergency Drugs available, Suction available and Patient being monitored Patient Re-evaluated:Patient Re-evaluated prior to inductionOxygen Delivery Method: Circle System Utilized Preoxygenation: Pre-oxygenation with 100% oxygen Intubation Type: IV induction Ventilation: Mask ventilation without difficulty LMA: LMA inserted LMA Size: 5.0 Number of attempts: 1 Airway Equipment and Method: bite block Placement Confirmation: positive ETCO2 Tube secured with: Tape Dental Injury: Teeth and Oropharynx as per pre-operative assessment

## 2013-08-31 NOTE — Interval H&P Note (Signed)
History and Physical Interval Note:  08/31/2013 7:59 AM  [name]  has presented today for surgery, with the diagnosis of HEMATURIA, BLADDER LESION  The various methods of treatment have been discussed with the patient and family. After consideration of risks, benefits and other options for treatment, the patient has consented to  Procedure(s): CYSTOSCOPY/BLADDER BIOPSY /RETROGRADE/ POSSIBLE URETEROSCOPY (Bilateral) as a surgical intervention .  The patient's history has been reviewed, patient examined, no change in status, stable for surgery.  I have reviewed the patient's chart and labs.  Questions were answered to the patient's satisfaction.     Jorja Loa

## 2013-08-31 NOTE — Transfer of Care (Signed)
Immediate Anesthesia Transfer of Care Note  Patient: [name]  Procedure(s) Performed: Procedure(s) (LRB): CYSTOSCOPY/BLADDER BIOPSY /RETROGRADE/ TURBT (N/A)  Patient Location: PACU  Anesthesia Type: General  Level of Consciousness: awake, sedated, patient cooperative and responds to stimulation  Airway & Oxygen Therapy: Patient Spontanous Breathing and Patient connected to face mask oxygen  Post-op Assessment: Report given to PACU RN, Post -op Vital signs reviewed and stable and Patient moving all extremities  Post vital signs: Reviewed and stable  Complications: No apparent anesthesia complications

## 2013-08-31 NOTE — Anesthesia Postprocedure Evaluation (Signed)
Anesthesia Post Note  Patient: [name]  Procedure(s) Performed: Procedure(s) (LRB): CYSTOSCOPY/BLADDER BIOPSY /RETROGRADE/ TURBT (N/A)  Anesthesia type: General  Patient location: PACU  Post pain: Pain level controlled  Post assessment: Post-op Vital signs reviewed  Last Vitals: BP 120/79  Pulse 57  Temp(Src) 36.6 C (Oral)  Resp 13  Ht 6\' 1"  (1.854 m)  Wt 231 lb 8 oz (105.008 kg)  BMI 30.55 kg/m2  SpO2 100%  Post vital signs: Reviewed  Level of consciousness: sedated  Complications: No apparent anesthesia complications

## 2013-08-31 NOTE — Progress Notes (Signed)
Dr. Diona Fanti and Dr. Lissa Hoard informed of ekg done today.

## 2013-09-03 ENCOUNTER — Encounter (HOSPITAL_BASED_OUTPATIENT_CLINIC_OR_DEPARTMENT_OTHER): Payer: Self-pay | Admitting: Urology

## 2013-09-22 ENCOUNTER — Ambulatory Visit (INDEPENDENT_AMBULATORY_CARE_PROVIDER_SITE_OTHER): Payer: Federal, State, Local not specified - PPO | Admitting: Urology

## 2013-09-22 DIAGNOSIS — R300 Dysuria: Secondary | ICD-10-CM

## 2013-09-22 DIAGNOSIS — C679 Malignant neoplasm of bladder, unspecified: Secondary | ICD-10-CM

## 2013-09-29 ENCOUNTER — Ambulatory Visit (INDEPENDENT_AMBULATORY_CARE_PROVIDER_SITE_OTHER): Payer: Federal, State, Local not specified - PPO | Admitting: Urology

## 2013-09-29 DIAGNOSIS — C679 Malignant neoplasm of bladder, unspecified: Secondary | ICD-10-CM

## 2013-10-13 ENCOUNTER — Ambulatory Visit (INDEPENDENT_AMBULATORY_CARE_PROVIDER_SITE_OTHER): Payer: Federal, State, Local not specified - PPO | Admitting: Urology

## 2013-10-13 DIAGNOSIS — C679 Malignant neoplasm of bladder, unspecified: Secondary | ICD-10-CM

## 2013-10-13 DIAGNOSIS — R300 Dysuria: Secondary | ICD-10-CM

## 2013-10-20 ENCOUNTER — Ambulatory Visit (INDEPENDENT_AMBULATORY_CARE_PROVIDER_SITE_OTHER): Payer: Federal, State, Local not specified - PPO | Admitting: Urology

## 2013-10-20 DIAGNOSIS — C679 Malignant neoplasm of bladder, unspecified: Secondary | ICD-10-CM

## 2013-10-27 ENCOUNTER — Ambulatory Visit (INDEPENDENT_AMBULATORY_CARE_PROVIDER_SITE_OTHER): Payer: Federal, State, Local not specified - PPO | Admitting: Urology

## 2013-10-27 DIAGNOSIS — C679 Malignant neoplasm of bladder, unspecified: Secondary | ICD-10-CM

## 2013-11-03 ENCOUNTER — Ambulatory Visit (INDEPENDENT_AMBULATORY_CARE_PROVIDER_SITE_OTHER): Payer: Federal, State, Local not specified - PPO | Admitting: Urology

## 2013-11-03 DIAGNOSIS — C679 Malignant neoplasm of bladder, unspecified: Secondary | ICD-10-CM

## 2013-11-05 ENCOUNTER — Other Ambulatory Visit: Payer: Self-pay | Admitting: Urology

## 2013-12-15 ENCOUNTER — Encounter (HOSPITAL_COMMUNITY): Payer: Self-pay | Admitting: Pharmacy Technician

## 2013-12-21 NOTE — Patient Instructions (Signed)
[name]  12/21/2013   Your procedure is scheduled on:  12/29/13  Report to [facility] at 10:30 AM.  Call this number if you have problems the morning of surgery: [phone]   Remember:   Do not eat food or drink liquids after midnight.   Take these medicines the morning of surgery with A SIP OF WATER: Atenolol and Alprazolam.   Do not wear jewelry, make-up or nail polish.  Do not wear lotions, powders, or perfumes. You may wear deodorant.  Do not shave 48 hours prior to surgery. Men may shave face and neck.  Do not bring valuables to the hospital.  [facility] is not responsible for any belongings or valuables.               Contacts, dentures or bridgework may not be worn into surgery.  Leave suitcase in the car. After surgery it may be brought to your room.  For patients admitted to the hospital, discharge time is determined by your treatment team.               Patients discharged the day of surgery will not be allowed to drive home.    Special Instructions: Shower using Hibiclens wash the night before surgery and the morning of surgery.   Please read over the following fact sheets that you were given: Anesthesia Post-op Instructions and Care and Recovery After Surgery    Cystoscopy Cystoscopy is a procedure that is used to help your caregiver diagnose and sometimes treat conditions that affect your lower urinary tract. Your lower urinary tract includes your bladder and the tube through which urine passes from your bladder out of your body (urethra). Cystoscopy is performed with a thin, tube-shaped instrument (cystoscope). The cystoscope has lenses and a light at the end so that your caregiver can see inside your bladder. The cystoscope is inserted at the entrance of your urethra. Your caregiver guides it through your urethra and into your bladder. There are two main types of cystoscopy:  Flexible cystoscopy (with a flexible cystoscope).  Rigid cystoscopy (with a rigid  cystoscope). Cystoscopy may be recommended for many conditions, including:  Urinary tract infections.  Blood in your urine (hematuria).  Loss of bladder control (urinary incontinence) or overactive bladder.  Unusual cells found in a urine sample.  Urinary blockage.  Painful urination. Cystoscopy may also be done to remove a sample of your tissue to be checked under a microscope (biopsy). It may also be done to remove or destroy bladder stones. LET YOUR CAREGIVER KNOW ABOUT:  Allergies to food or medicine.  Medicines taken, including vitamins, herbs, eyedrops, over-the-counter medicines, and creams.  Use of steroids (by mouth or creams).  Previous problems with anesthetics or numbing medicines.  History of bleeding problems or blood clots.  Previous surgery.  Other health problems, including diabetes and kidney problems.  Possibility of pregnancy, if this applies. PROCEDURE The area around the opening to your urethra will be cleaned. A medicine to numb your urethra (local anesthetic) is used. If a tissue sample or stone is removed during the procedure, you may be given a medicine to make you sleep (general anesthetic). Your caregiver will gently insert the tip of the cystoscope into your urethra. The cystoscope will be slowly glided through your urethra and into your bladder. Sterile fluid will flow through the cystoscope and into your bladder. The fluid will expand and stretch your bladder. This gives your caregiver a better view of your bladder walls.  The procedure lasts about 15-20 minutes. AFTER THE PROCEDURE If a local anesthetic is used, you will be allowed to go home as soon as you are ready. If a general anesthetic is used, you will be taken to a recovery area until you are stable. You may have temporary bleeding and burning on urination. Document Released: 06/15/2000 Document Revised: 03/12/2012 Document Reviewed: 12/10/2011 [facility] Patient Information 2015 [place],  [place]. This information is not intended to replace advice given to you by your health care Breelyn Icard. Make sure you discuss any questions you have with your health care Abrie Egloff.   Cystoscopy, Care After Refer to this sheet in the next few weeks. These instructions provide you with information on caring for yourself after your procedure. Your caregiver may also give you more specific instructions. Your treatment has been planned according to current medical practices, but problems sometimes occur. Call your caregiver if you have any problems or questions after your procedure. HOME CARE INSTRUCTIONS  Things you can do to ease any discomfort after your procedure include:  Drinking enough water and fluids to keep your urine clear or pale yellow.  Taking a warm bath to relieve any burning feelings. SEEK IMMEDIATE MEDICAL CARE IF:   You have an increase in blood in your urine.  You notice blood clots in your urine.  You have difficulty passing urine.  You have the chills.  You have abdominal pain.  You have a fever or persistent symptoms for more than 2-3 days.  You have a fever and your symptoms suddenly get worse. MAKE SURE YOU:   Understand these instructions.  Will watch your condition.  Will get help right away if you are not doing well or get worse. Document Released: 01/05/2005 Document Revised: 02/18/2013 Document Reviewed: 12/10/2011 [facility] Patient Information 2015 [place], [place]. This information is not intended to replace advice given to you by your health care Amore Ackman. Make sure you discuss any questions you have with your health care Gram Siedlecki.   PATIENT INSTRUCTIONS POST-ANESTHESIA  IMMEDIATELY FOLLOWING SURGERY:  Do not drive or operate machinery for the first twenty four hours after surgery.  Do not make any important decisions for twenty four hours after surgery or while taking narcotic pain medications or sedatives.  If you develop intractable nausea and vomiting  or a severe headache please notify your doctor immediately.  FOLLOW-UP:  Please make an appointment with your surgeon as instructed. You do not need to follow up with anesthesia unless specifically instructed to do so.  WOUND CARE INSTRUCTIONS (if applicable):  Keep a dry clean dressing on the anesthesia/puncture wound site if there is drainage.  Once the wound has quit draining you may leave it open to air.  Generally you should leave the bandage intact for twenty four hours unless there is drainage.  If the epidural site drains for more than 36-48 hours please call the anesthesia department.  QUESTIONS?:  Please feel free to call your physician or the hospital operator if you have any questions, and they will be happy to assist you.

## 2013-12-22 ENCOUNTER — Encounter (HOSPITAL_COMMUNITY): Payer: Self-pay

## 2013-12-22 ENCOUNTER — Other Ambulatory Visit: Payer: Self-pay

## 2013-12-22 ENCOUNTER — Encounter (HOSPITAL_COMMUNITY)
Admission: RE | Admit: 2013-12-22 | Discharge: 2013-12-22 | Disposition: A | Discharge status: Home / Self Care | Payer: Federal, State, Local not specified - PPO | Source: Ambulatory Visit | Attending: Urology | Admitting: Urology

## 2013-12-22 DIAGNOSIS — Z01810 Encounter for preprocedural cardiovascular examination: Secondary | ICD-10-CM | POA: Insufficient documentation

## 2013-12-22 DIAGNOSIS — Z01812 Encounter for preprocedural laboratory examination: Secondary | ICD-10-CM | POA: Insufficient documentation

## 2013-12-22 DIAGNOSIS — Z01818 Encounter for other preprocedural examination: Secondary | ICD-10-CM | POA: Insufficient documentation

## 2013-12-22 HISTORY — DX: Anxiety disorder, unspecified: F41.9

## 2013-12-22 LAB — BASIC METABOLIC PANEL
BUN: 13 mg/dL (ref 6–23)
CALCIUM: 9.5 mg/dL (ref 8.4–10.5)
CO2: 26 mEq/L (ref 19–32)
Chloride: 101 mEq/L (ref 96–112)
Creatinine, Ser: 0.7 mg/dL (ref 0.50–1.35)
GFR calc Af Amer: >90 mL/min (ref >90)
GFR calc non Af Amer: >90 mL/min (ref >90)
Glucose, Bld: 144 mg/dL — ABNORMAL HIGH (ref 70–99)
Potassium: 4.3 mEq/L (ref 3.7–5.3)
SODIUM: 139 meq/L (ref 137–147)

## 2013-12-22 LAB — HEMOGLOBIN AND HEMATOCRIT, BLOOD
HEMATOCRIT: 40.8 % (ref 39.0–52.0)
Hemoglobin: 14.1 g/dL (ref 13.0–17.0)

## 2013-12-22 NOTE — Pre-Procedure Instructions (Signed)
Patient given information to sign up for my chart at home. 

## 2013-12-29 ENCOUNTER — Ambulatory Visit (HOSPITAL_COMMUNITY)
Admission: RE | Admit: 2013-12-29 | Discharge: 2013-12-29 | Disposition: A | Discharge status: Home / Self Care | Payer: Federal, State, Local not specified - PPO | Source: Ambulatory Visit | Attending: Urology | Admitting: Urology

## 2013-12-29 ENCOUNTER — Ambulatory Visit (HOSPITAL_COMMUNITY): Payer: Federal, State, Local not specified - PPO | Admitting: Anesthesiology

## 2013-12-29 ENCOUNTER — Encounter (HOSPITAL_COMMUNITY): Payer: Self-pay | Admitting: *Deleted

## 2013-12-29 ENCOUNTER — Encounter (HOSPITAL_COMMUNITY)
Admission: RE | Disposition: A | Discharge status: Home / Self Care | Payer: Self-pay | Source: Ambulatory Visit | Attending: Urology

## 2013-12-29 ENCOUNTER — Encounter (HOSPITAL_COMMUNITY): Payer: Federal, State, Local not specified - PPO | Admitting: Anesthesiology

## 2013-12-29 DIAGNOSIS — F17200 Nicotine dependence, unspecified, uncomplicated: Secondary | ICD-10-CM | POA: Insufficient documentation

## 2013-12-29 DIAGNOSIS — E785 Hyperlipidemia, unspecified: Secondary | ICD-10-CM | POA: Insufficient documentation

## 2013-12-29 DIAGNOSIS — M129 Arthropathy, unspecified: Secondary | ICD-10-CM | POA: Insufficient documentation

## 2013-12-29 DIAGNOSIS — Z79899 Other long term (current) drug therapy: Secondary | ICD-10-CM | POA: Insufficient documentation

## 2013-12-29 DIAGNOSIS — C679 Malignant neoplasm of bladder, unspecified: Secondary | ICD-10-CM | POA: Insufficient documentation

## 2013-12-29 DIAGNOSIS — F411 Generalized anxiety disorder: Secondary | ICD-10-CM | POA: Insufficient documentation

## 2013-12-29 DIAGNOSIS — N3090 Cystitis, unspecified without hematuria: Secondary | ICD-10-CM | POA: Insufficient documentation

## 2013-12-29 HISTORY — PX: CYSTOSCOPY WITH BIOPSY: SHX5122

## 2013-12-29 SURGERY — CYSTOSCOPY, WITH BIOPSY
Anesthesia: General

## 2013-12-29 MED ORDER — ONDANSETRON HCL 4 MG/2ML IJ SOLN
INTRAMUSCULAR | Status: AC
  Filled 2013-12-29: qty 2

## 2013-12-29 MED ORDER — CIPROFLOXACIN IN D5W 400 MG/200ML IV SOLN
INTRAVENOUS | Status: AC
  Filled 2013-12-29: qty 200

## 2013-12-29 MED ORDER — LACTATED RINGERS IV SOLN
INTRAVENOUS | Status: DC | PRN
  Administered 2013-12-29 (×2): via INTRAVENOUS

## 2013-12-29 MED ORDER — STERILE WATER FOR IRRIGATION IR SOLN
Status: DC | PRN
  Administered 2013-12-29: 1000 mL

## 2013-12-29 MED ORDER — EPHEDRINE SULFATE 50 MG/ML IJ SOLN
INTRAMUSCULAR | Status: AC
  Filled 2013-12-29: qty 1

## 2013-12-29 MED ORDER — EPHEDRINE SULFATE 50 MG/ML IJ SOLN
INTRAMUSCULAR | Status: DC | PRN
  Administered 2013-12-29: 10 mg via INTRAVENOUS

## 2013-12-29 MED ORDER — SODIUM CHLORIDE 0.9 % IJ SOLN
INTRAMUSCULAR | Status: AC
  Filled 2013-12-29: qty 10

## 2013-12-29 MED ORDER — FENTANYL CITRATE 0.05 MG/ML IJ SOLN
25.0000 ug | INTRAMUSCULAR | Status: AC
  Administered 2013-12-29: 25 ug via INTRAVENOUS

## 2013-12-29 MED ORDER — CIPROFLOXACIN HCL 250 MG PO TABS: 250.0000 mg | ORAL_TABLET | Freq: Two times a day (BID) | ORAL | Status: DC

## 2013-12-29 MED ORDER — STERILE WATER FOR IRRIGATION IR SOLN
Status: DC | PRN
Start: 2013-12-29 — End: 2013-12-29
  Administered 2013-12-29: 3000 mL

## 2013-12-29 MED ORDER — ARTIFICIAL TEARS OP OINT
TOPICAL_OINTMENT | OPHTHALMIC | Status: DC | PRN
  Administered 2013-12-29: 1 via OPHTHALMIC

## 2013-12-29 MED ORDER — CIPROFLOXACIN IN D5W 400 MG/200ML IV SOLN
400.0000 mg | INTRAVENOUS | Status: AC
  Administered 2013-12-29: 400 mg via INTRAVENOUS

## 2013-12-29 MED ORDER — PROPOFOL 10 MG/ML IV BOLUS
INTRAVENOUS | Status: DC | PRN
  Administered 2013-12-29: 180 mg via INTRAVENOUS

## 2013-12-29 MED ORDER — LIDOCAINE HCL (CARDIAC) 20 MG/ML IV SOLN
INTRAVENOUS | Status: DC | PRN
Start: 2013-12-29 — End: 2013-12-29
  Administered 2013-12-29: 50 mg via INTRAVENOUS

## 2013-12-29 MED ORDER — MIDAZOLAM HCL 2 MG/2ML IJ SOLN
INTRAMUSCULAR | Status: AC
  Filled 2013-12-29: qty 2

## 2013-12-29 MED ORDER — URIBEL 118 MG PO CAPS: 1.0000 | ORAL_CAPSULE | Freq: Three times a day (TID) | ORAL | Status: DC | PRN

## 2013-12-29 MED ORDER — GLYCOPYRROLATE 0.2 MG/ML IJ SOLN
0.2000 mg | Freq: Once | INTRAMUSCULAR | Status: AC
  Administered 2013-12-29: 0.2 mg via INTRAVENOUS

## 2013-12-29 MED ORDER — FENTANYL CITRATE 0.05 MG/ML IJ SOLN
INTRAMUSCULAR | Status: DC | PRN
  Administered 2013-12-29 (×3): 50 ug via INTRAVENOUS
  Administered 2013-12-29: 100 ug via INTRAVENOUS

## 2013-12-29 MED ORDER — MIDAZOLAM HCL 2 MG/2ML IJ SOLN
1.0000 mg | INTRAMUSCULAR | Status: DC | PRN
  Administered 2013-12-29: 2 mg via INTRAVENOUS

## 2013-12-29 MED ORDER — FENTANYL CITRATE 0.05 MG/ML IJ SOLN
INTRAMUSCULAR | Status: AC
  Filled 2013-12-29: qty 5

## 2013-12-29 MED ORDER — FENTANYL CITRATE 0.05 MG/ML IJ SOLN
INTRAMUSCULAR | Status: AC
  Filled 2013-12-29: qty 2

## 2013-12-29 MED ORDER — FENTANYL CITRATE 0.05 MG/ML IJ SOLN: 25.0000 ug | INTRAMUSCULAR | Status: DC | PRN

## 2013-12-29 MED ORDER — LIDOCAINE HCL (PF) 1 % IJ SOLN
INTRAMUSCULAR | Status: AC
  Filled 2013-12-29: qty 5

## 2013-12-29 MED ORDER — ONDANSETRON HCL 4 MG/2ML IJ SOLN
4.0000 mg | Freq: Once | INTRAMUSCULAR | Status: DC | PRN
Start: 2013-12-29 — End: 2013-12-30

## 2013-12-29 MED ORDER — LACTATED RINGERS IV SOLN
INTRAVENOUS | Status: DC
  Administered 2013-12-29: 11:00:00 via INTRAVENOUS

## 2013-12-29 MED ORDER — GLYCOPYRROLATE 0.2 MG/ML IJ SOLN
INTRAMUSCULAR | Status: AC
  Filled 2013-12-29: qty 1

## 2013-12-29 MED ORDER — ONDANSETRON HCL 4 MG/2ML IJ SOLN
4.0000 mg | Freq: Once | INTRAMUSCULAR | Status: AC
  Administered 2013-12-29: 4 mg via INTRAVENOUS

## 2013-12-29 SURGICAL SUPPLY — 18 items
BAG DRAIN URO TABLE W/ADPT NS (DRAPE) ×3 IMPLANT
BAG DRN 8 ADPR NS SKTRN CSTL (DRAPE) ×1
CLOTH BEACON ORANGE TIMEOUT ST (SAFETY) ×3 IMPLANT
ELECT REM PT RETURN 9FT ADLT (ELECTROSURGICAL) ×3
ELECTRODE REM PT RTRN 9FT ADLT (ELECTROSURGICAL) ×1 IMPLANT
FORMALIN 10 PREFIL 120ML (MISCELLANEOUS) ×2 IMPLANT
GLOVE BIO SURGEON STRL SZ8 (GLOVE) ×3 IMPLANT
GLOVE BIOGEL PI IND STRL 7.0 (GLOVE) IMPLANT
GLOVE BIOGEL PI INDICATOR 7.0 (GLOVE) ×6
GLOVE ECLIPSE 6.5 STRL STRAW (GLOVE) ×2 IMPLANT
GOWN STRL REUS W/TWL LRG LVL3 (GOWN DISPOSABLE) ×3 IMPLANT
GOWN STRL REUS W/TWL XL LVL3 (GOWN DISPOSABLE) ×4 IMPLANT
KIT ROOM TURNOVER AP CYSTO (KITS) ×3 IMPLANT
MANIFOLD NEPTUNE II (INSTRUMENTS) ×3 IMPLANT
PACK CYSTO (CUSTOM PROCEDURE TRAY) ×3 IMPLANT
PAD TELFA 3X4 1S STER (GAUZE/BANDAGES/DRESSINGS) ×3 IMPLANT
WATER STERILE IRR 1000ML POUR (IV SOLUTION) ×2 IMPLANT
WATER STERILE IRR 3000ML UROMA (IV SOLUTION) ×3 IMPLANT

## 2013-12-29 NOTE — H&P (Signed)
Urology History and Physical Exam  CC: Bladder cancer  HPI: 53 year old male presents for repeat cystoscopy and bladder biopsies. He underwent TURBT on 08/31/2013. Path revealed NMIBC/CIS. Muscle was present in the specimen. He then underwent BCG induction, having completed his 6th treatment on 11/03/2013. HE now presents for repeat biopsy.  PMH: Past Medical History  Diagnosis Date  . Lesion of bladder   . Hematuria   . Tachycardia     PT UNSURE TYPE--  CONTROLLED W/ ATENOLOL (ALSO STATES HAD NORMAL STRESS TEST )  . Hyperlipidemia   . History of small bowel obstruction     2007 W/ SURGICAL INTERVENTION  . Sigmoid diverticulum   . Asymptomatic cholelithiasis   . Hernia, ventral     INCISIONAL  . Anxiety   . Arthritis     PSH: Past Surgical History  Procedure Laterality Date  . Colonoscopy N/A 05/12/2013    Procedure: COLONOSCOPY;  Surgeon: Jamesetta So, MD;  Location: [facility];  Service: Gastroenterology;  Laterality: N/A;  . Exploratory laparotomy/  lysis adhesions/  meckel's diverticulectomy  11-02-2005    SBO  . Urinary tract surgery with suprapubic approach  AGE 79  . Abdominal hernia repair  2002  . Appendectomy  AGE 44  . Cysto/  urethral dilation  AGE 44  . Cystoscopy/retrograde/ureteroscopy N/A 08/31/2013    Procedure: CYSTOSCOPY/BLADDER BIOPSY /RETROGRADE/ TURBT;  Surgeon: Franchot Gallo, MD;  Location: [facility];  Service: Urology;  Laterality: N/A;    Allergies: Allergies  Allergen Reactions  . Enbrel [Etanercept] Rash    Medications: Prescriptions prior to admission  Medication Sig Dispense Refill  . ALPRAZolam (XANAX) 1 MG tablet Take 1 mg by mouth daily.      Marland Kitchen atenolol (TENORMIN) 50 MG tablet Take 50 mg by mouth every morning.       . cyanocobalamin 2000 MCG tablet Take 2,000 mcg by mouth daily. B12      . naproxen (NAPROSYN) 500 MG tablet Take 500 mg by mouth 2 (two) times daily as needed for moderate pain.         Social  History: History   Social History  . Marital Status: Married    Spouse Name: N/A    Number of Children: N/A  . Years of Education: N/A   Occupational History  . Not on file.   Social History Main Topics  . Smoking status: Current Every Day Smoker -- 1.50 packs/day for 30 years    Types: Cigarettes  . Smokeless tobacco: Never Used  . Alcohol Use: Yes     Comment: social  . Drug Use: No  . Sexual Activity: Not on file   Other Topics Concern  . Not on file   Social History Narrative  . No narrative on file    Family History: No family history on file.  Review of Systems: Positive: Frequency/urgency Negative:   A further 10 point review of systems was negative except what is listed in the HPI.                  Physical Exam: @VITALS2 @ General: No acute distress.  Awake. Head:  Normocephalic.  Atraumatic. ENT:  EOMI.  Mucous membranes moist Neck:  Supple.  No lymphadenopathy. CV:  S1 present. S2 present. Regular rate. Pulmonary: Equal effort bilaterally.  Clear to auscultation bilaterally. Abdomen: Soft.  Non tender to palpation. Skin:  Normal turgor.  No visible rash. Extremity: No gross deformity of bilateral upper extremities.  No gross deformity of                             lower extremities. Neurologic: Alert. Appropriate mood.   Studies:  No results found for this basename: HGB, WBC, PLT,  in the last 72 hours  No results found for this basename: NA, K, CL, CO2, BUN, CREATININE, CALCIUM, MAGNESIUM, GFRNONAA, GFRAA,  in the last 72 hours   No results found for this basename: PT, INR, APTT,  in the last 72 hours   No components found with this basename: ABG,     Assessment:  NMIBC/CIS of bladder, s/p BCG induction  Plan: Anesthetic cystoscopy, bladder biopsy

## 2013-12-29 NOTE — Discharge Instructions (Signed)

## 2013-12-29 NOTE — Transfer of Care (Signed)
Immediate Anesthesia Transfer of Care Note  Patient: [name]  Procedure(s) Performed: Procedure(s): CYSTOSCOPY WITH BLADDER NECK BIOPSIES (N/A)  Patient Location: PACU  Anesthesia Type:General  Level of Consciousness: awake, alert , oriented and patient cooperative  Airway & Oxygen Therapy: Patient Spontanous Breathing and Patient connected to face mask oxygen  Post-op Assessment: Report given to PACU RN and Post -op Vital signs reviewed and stable  Post vital signs: Reviewed and stable  Complications: No apparent anesthesia complications

## 2013-12-29 NOTE — Anesthesia Postprocedure Evaluation (Signed)
  Anesthesia Post-op Note  Patient: [name]  Procedure(s) Performed: Procedure(s): CYSTOSCOPY WITH BLADDER NECK BIOPSIES (N/A)  Patient Location: PACU  Anesthesia Type:General  Level of Consciousness: awake, alert , oriented and patient cooperative  Airway and Oxygen Therapy: Patient Spontanous Breathing and Patient connected to face mask oxygen  Post-op Pain: none  Post-op Assessment: Post-op Vital signs reviewed, Patient's Cardiovascular Status Stable, Respiratory Function Stable, Patent Airway, No signs of Nausea or vomiting and Pain level controlled  Post-op Vital Signs: Reviewed and stable  Last Vitals:  Filed Vitals:   12/29/13 1300  BP: 113/57  Pulse: 58  Temp: 36.6 C  Resp: 12    Complications: No apparent anesthesia complications

## 2013-12-29 NOTE — Op Note (Signed)
PATIENT:  [name]  PRE-OPERATIVE DIAGNOSIS: Non-muscle invasive bladder cancer, high-grade, stage TI, status post immunotherapy with BCG  POST-OPERATIVE DIAGNOSIS: Same  PROCEDURE: Cystoscopy, bladder biopsy  SURGEON:  Lillette Boxer. Dahlstedt, M.D.  ANESTHESIA:  General  EBL:  Minimal   LOCAL MEDICATIONS USED:  None  SPECIMEN:  Bladder biopsies-bladder neck  INDICATION: [name] is a 53 year old male who underwent TURBT of the bladder neck and trigone lesion in the spring of 2015. Biopsies revealed high-grade urothelial carcinoma with lamina propria but no muscle involvement. Muscle was included in the specimen. He completed BCG induction therapy on 11/03/2013. He did not have significant side effects from that. He presents at this time for cystoscopy and bladder biopsy to assess treatment.  Description of procedure: The patient was properly identified and marked (if applicable) in the holding area. They were then  taken to the operating room and placed on the table in a supine position. General anesthesia was then administered. Once fully anesthetized the patient was moved to the dorsolithotomy position and the genitalia and perineum were sterilely prepped and draped in standard fashion. An official timeout was then performed.  A 22 French panendoscope was advanced under direct vision to the patient's urethra. No urethral lesions were noted. Prostate was nonobstructive, without lesions. Bladder was entered and inspected circumferentially. No tumors, trabeculations or foreign bodies were noted. Ureteral orifices were normal in configuration and location. There was scarring present in the left trigonal lesion consistent with prior biopsy. No urothelial abnormalities were noted. Both the 12 and 70 lenses were used for inspection. I then used the cold cup biopsy forceps to take approximately 5 biopsies from the left trigonal and bladder neck region. These were sent as "bladder neck  biopsies". I then used the Bugbee electrode to cauterize the biopsy sites. Hemostasis was adequate at this point. I then drained the bladder, and removed the cystoscope.  The patient tolerated the procedure well.  PLAN OF CARE: Discharge to home after PACU  PATIENT DISPOSITION:  PACU - hemodynamically stable.

## 2013-12-29 NOTE — Anesthesia Preprocedure Evaluation (Signed)
Anesthesia Evaluation  Patient identified by MRN, date of birth, ID band Patient awake    Reviewed: Allergy & Precautions, H&P , NPO status , Patient's Chart, lab work & pertinent test results, reviewed documented beta blocker date and time   Airway Mallampati: II TM Distance: >3 FB Neck ROM: Full    Dental  (+) Dental Advisory Given, Teeth Intact, Implants   Pulmonary Current Smoker,  breath sounds clear to auscultation        Cardiovascular Dysrhythmias: tachycardia hx under control. Rhythm:Regular Rate:Normal     Neuro/Psych PSYCHIATRIC DISORDERS Anxiety negative neurological ROS  negative psych ROS   GI/Hepatic negative GI ROS, Neg liver ROS,   Endo/Other  negative endocrine ROS  Renal/GU negative Renal ROS     Musculoskeletal  (+) Arthritis -, Rheumatoid disorders,    Abdominal   Peds  Hematology negative hematology ROS (+)   Anesthesia Other Findings   Reproductive/Obstetrics negative OB ROS                           Anesthesia Physical Anesthesia Plan  ASA: II  Anesthesia Plan: General   Post-op Pain Management:    Induction: Intravenous  Airway Management Planned: LMA  Additional Equipment:   Intra-op Plan:   Post-operative Plan: Extubation in OR  Informed Consent: I have reviewed the patients History and Physical, chart, labs and discussed the procedure including the risks, benefits and alternatives for the proposed anesthesia with the patient or authorized representative who has indicated his/her understanding and acceptance.     Plan Discussed with:   Anesthesia Plan Comments:         Anesthesia Quick Evaluation

## 2013-12-29 NOTE — Anesthesia Procedure Notes (Signed)
Procedure Name: LMA Insertion Date/Time: 12/29/2013 12:20 PM Performed by: Andree Elk, AMY A Pre-anesthesia Checklist: Patient identified, Timeout performed, Emergency Drugs available, Suction available and Patient being monitored Patient Re-evaluated:Patient Re-evaluated prior to inductionOxygen Delivery Method: Circle system utilized Preoxygenation: Pre-oxygenation with 100% oxygen Intubation Type: IV induction Ventilation: Mask ventilation without difficulty LMA: LMA inserted LMA Size: 5.0 Number of attempts: 1 Placement Confirmation: positive ETCO2 and breath sounds checked- equal and bilateral Tube secured with: Tape Dental Injury: Teeth and Oropharynx as per pre-operative assessment

## 2014-01-12 ENCOUNTER — Ambulatory Visit (INDEPENDENT_AMBULATORY_CARE_PROVIDER_SITE_OTHER): Payer: Federal, State, Local not specified - PPO | Admitting: Urology

## 2014-01-12 DIAGNOSIS — C675 Malignant neoplasm of bladder neck: Secondary | ICD-10-CM

## 2014-04-13 ENCOUNTER — Ambulatory Visit (INDEPENDENT_AMBULATORY_CARE_PROVIDER_SITE_OTHER): Payer: Federal, State, Local not specified - PPO | Admitting: Urology

## 2014-04-13 DIAGNOSIS — C675 Malignant neoplasm of bladder neck: Secondary | ICD-10-CM

## 2014-07-20 ENCOUNTER — Ambulatory Visit (INDEPENDENT_AMBULATORY_CARE_PROVIDER_SITE_OTHER): Payer: Federal, State, Local not specified - PPO | Admitting: Urology

## 2014-07-20 DIAGNOSIS — C675 Malignant neoplasm of bladder neck: Secondary | ICD-10-CM

## 2014-08-03 ENCOUNTER — Ambulatory Visit (INDEPENDENT_AMBULATORY_CARE_PROVIDER_SITE_OTHER): Payer: Federal, State, Local not specified - PPO | Admitting: Urology

## 2014-08-03 DIAGNOSIS — C675 Malignant neoplasm of bladder neck: Secondary | ICD-10-CM

## 2014-08-10 ENCOUNTER — Ambulatory Visit (INDEPENDENT_AMBULATORY_CARE_PROVIDER_SITE_OTHER): Payer: Federal, State, Local not specified - PPO | Admitting: Urology

## 2014-08-10 DIAGNOSIS — C675 Malignant neoplasm of bladder neck: Secondary | ICD-10-CM

## 2014-08-17 ENCOUNTER — Ambulatory Visit (INDEPENDENT_AMBULATORY_CARE_PROVIDER_SITE_OTHER): Payer: Federal, State, Local not specified - PPO | Admitting: Urology

## 2014-08-17 DIAGNOSIS — C675 Malignant neoplasm of bladder neck: Secondary | ICD-10-CM

## 2014-11-16 ENCOUNTER — Ambulatory Visit (INDEPENDENT_AMBULATORY_CARE_PROVIDER_SITE_OTHER): Payer: Federal, State, Local not specified - PPO | Admitting: Urology

## 2014-11-16 DIAGNOSIS — D090 Carcinoma in situ of bladder: Secondary | ICD-10-CM

## 2014-11-30 ENCOUNTER — Ambulatory Visit (INDEPENDENT_AMBULATORY_CARE_PROVIDER_SITE_OTHER): Payer: Federal, State, Local not specified - PPO | Admitting: Urology

## 2014-11-30 DIAGNOSIS — D090 Carcinoma in situ of bladder: Secondary | ICD-10-CM

## 2014-12-07 ENCOUNTER — Ambulatory Visit (INDEPENDENT_AMBULATORY_CARE_PROVIDER_SITE_OTHER): Payer: Federal, State, Local not specified - PPO | Admitting: Urology

## 2014-12-07 DIAGNOSIS — D090 Carcinoma in situ of bladder: Secondary | ICD-10-CM | POA: Diagnosis not present

## 2014-12-17 ENCOUNTER — Ambulatory Visit (INDEPENDENT_AMBULATORY_CARE_PROVIDER_SITE_OTHER): Payer: Federal, State, Local not specified - PPO | Admitting: Urology

## 2014-12-17 DIAGNOSIS — C679 Malignant neoplasm of bladder, unspecified: Secondary | ICD-10-CM | POA: Diagnosis not present

## 2015-03-22 ENCOUNTER — Ambulatory Visit (INDEPENDENT_AMBULATORY_CARE_PROVIDER_SITE_OTHER): Payer: Federal, State, Local not specified - PPO | Admitting: Urology

## 2015-03-22 DIAGNOSIS — C675 Malignant neoplasm of bladder neck: Secondary | ICD-10-CM

## 2015-03-23 ENCOUNTER — Ambulatory Visit: Payer: Federal, State, Local not specified - PPO

## 2015-04-05 ENCOUNTER — Ambulatory Visit (INDEPENDENT_AMBULATORY_CARE_PROVIDER_SITE_OTHER): Payer: Federal, State, Local not specified - PPO | Admitting: Urology

## 2015-04-05 DIAGNOSIS — C675 Malignant neoplasm of bladder neck: Secondary | ICD-10-CM | POA: Diagnosis not present

## 2015-04-12 ENCOUNTER — Ambulatory Visit (INDEPENDENT_AMBULATORY_CARE_PROVIDER_SITE_OTHER): Payer: Federal, State, Local not specified - PPO | Admitting: Urology

## 2015-04-12 DIAGNOSIS — C675 Malignant neoplasm of bladder neck: Secondary | ICD-10-CM

## 2015-04-19 ENCOUNTER — Ambulatory Visit (INDEPENDENT_AMBULATORY_CARE_PROVIDER_SITE_OTHER): Payer: Federal, State, Local not specified - PPO | Admitting: Urology

## 2015-04-19 DIAGNOSIS — C675 Malignant neoplasm of bladder neck: Secondary | ICD-10-CM

## 2015-07-19 ENCOUNTER — Ambulatory Visit (INDEPENDENT_AMBULATORY_CARE_PROVIDER_SITE_OTHER): Payer: Federal, State, Local not specified - PPO | Admitting: Urology

## 2015-07-19 DIAGNOSIS — C675 Malignant neoplasm of bladder neck: Secondary | ICD-10-CM | POA: Diagnosis not present

## 2015-07-19 DIAGNOSIS — R1030 Lower abdominal pain, unspecified: Secondary | ICD-10-CM

## 2015-08-02 ENCOUNTER — Ambulatory Visit (INDEPENDENT_AMBULATORY_CARE_PROVIDER_SITE_OTHER): Payer: Federal, State, Local not specified - PPO | Admitting: Urology

## 2015-08-02 DIAGNOSIS — C675 Malignant neoplasm of bladder neck: Secondary | ICD-10-CM

## 2015-08-09 ENCOUNTER — Ambulatory Visit (INDEPENDENT_AMBULATORY_CARE_PROVIDER_SITE_OTHER): Payer: Federal, State, Local not specified - PPO | Admitting: Urology

## 2015-08-09 DIAGNOSIS — C675 Malignant neoplasm of bladder neck: Secondary | ICD-10-CM

## 2015-08-16 ENCOUNTER — Ambulatory Visit (INDEPENDENT_AMBULATORY_CARE_PROVIDER_SITE_OTHER): Payer: Federal, State, Local not specified - PPO | Admitting: Urology

## 2015-08-16 DIAGNOSIS — C675 Malignant neoplasm of bladder neck: Secondary | ICD-10-CM

## 2015-09-14 ENCOUNTER — Ambulatory Visit (HOSPITAL_COMMUNITY)
Admission: RE | Admit: 2015-09-14 | Discharge: 2015-09-14 | Disposition: A | Discharge status: Home / Self Care | Payer: Federal, State, Local not specified - PPO | Source: Ambulatory Visit | Attending: Family Medicine | Admitting: Family Medicine

## 2015-09-14 ENCOUNTER — Other Ambulatory Visit (HOSPITAL_COMMUNITY): Payer: Self-pay | Admitting: Family Medicine

## 2015-09-14 DIAGNOSIS — M545 Low back pain, unspecified: Secondary | ICD-10-CM

## 2015-09-14 DIAGNOSIS — C7951 Secondary malignant neoplasm of bone: Secondary | ICD-10-CM | POA: Insufficient documentation

## 2015-09-14 DIAGNOSIS — M5442 Lumbago with sciatica, left side: Secondary | ICD-10-CM

## 2015-09-14 DIAGNOSIS — R590 Localized enlarged lymph nodes: Secondary | ICD-10-CM | POA: Diagnosis not present

## 2015-09-14 MED ORDER — GADOBENATE DIMEGLUMINE 529 MG/ML IV SOLN
20.0000 mL | Freq: Once | INTRAVENOUS | Status: AC | PRN
  Administered 2015-09-14: 20 mL via INTRAVENOUS

## 2015-09-15 ENCOUNTER — Encounter: Payer: Self-pay | Admitting: Radiation Oncology

## 2015-09-15 DIAGNOSIS — C7951 Secondary malignant neoplasm of bone: Secondary | ICD-10-CM | POA: Insufficient documentation

## 2015-09-15 DIAGNOSIS — C679 Malignant neoplasm of bladder, unspecified: Secondary | ICD-10-CM | POA: Insufficient documentation

## 2015-09-15 NOTE — Progress Notes (Addendum)
Histology and Location of Primary Cancer: History of bladder cancer and RA. Reports a month and a half ago after keeping his grandchildren he felt low back pain but, dismissed this because of chronic back issues. Then, wife notice he was having difficulty rising from a sitting position and initiating first step. They reached out to the RA doctor and were scheduled for PT. PT was never started due to scheduling conflicts. Patient went to PCP for evaluation of spine pain and leg weakness. Xray performed. Spine lesions identified. MRI ordered. Referral to rad onc made by PCP.   Sites of Visceral and Bony Metastatic Disease: spine  Location(s) of Symptomatic Metastases: Tumor extends from the right side of the vertebral body at L3 into the right psoas muscle, measuring about 4 cm in diameter. Tumor in the right pedicle of L5 encroaches upon the intervertebral foramina on the right at L4-5 and L5-S1 and could affect those exiting nerves.  Past/Anticipated chemotherapy by medical oncology, if any: None  Pain on a scale of 0-10 is:  Back and leg pain. Taking Percocet 10/325 mg. Presently, he reports left leg and base of spine pain 8 on a scale of 0-10.   If Spine Met(s), symptoms, if any, include:  Bowel/Bladder retention or incontinence (please describe): Reports bladder changes. Explains when he sits he does not have the sensation to void but, often when he stands up he must rush to the bathroom due to urinary urgency.  Reports constipation. Constipation management worksheets given and reviewed.   Numbness or weakness in extremities (please describe): Reports neuropathy in his feet.  Current Decadron regimen, if applicable: took prednisone prescribed by PCP for one day then, was directed to stop.    Ambulatory status? Walker? Wheelchair?: Ambulatory with cane.   SAFETY ISSUES:  Prior radiation? Bladder cancer tx at [facility] in 2015. Denies hx of chemotherapy or radiation. Does report receiving  bladder treatments from Dr. Diona Fanti.  Pacemaker/ICD?   Possible current pregnancy? no  Is the patient on methotrexate? Yes for RA  Current Complaints / other details:  55 year old male. Patient and wife question if Orencia (medication for RA) contributed to his bladder cancer.

## 2015-09-16 ENCOUNTER — Telehealth: Payer: Self-pay | Admitting: *Deleted

## 2015-09-16 ENCOUNTER — Ambulatory Visit
Admission: RE | Admit: 2015-09-16 | Discharge: 2015-09-16 | Disposition: A | Discharge status: Home / Self Care | Payer: Federal, State, Local not specified - PPO | Source: Ambulatory Visit | Attending: Radiation Oncology | Admitting: Radiation Oncology

## 2015-09-16 ENCOUNTER — Encounter: Payer: Self-pay | Admitting: Radiation Oncology

## 2015-09-16 ENCOUNTER — Telehealth: Payer: Self-pay | Admitting: Radiation Oncology

## 2015-09-16 VITALS — BP 114/57 | HR 70 | Resp 16 | Ht 73.0 in | Wt 227.9 lb

## 2015-09-16 DIAGNOSIS — M069 Rheumatoid arthritis, unspecified: Secondary | ICD-10-CM | POA: Insufficient documentation

## 2015-09-16 DIAGNOSIS — K5900 Constipation, unspecified: Secondary | ICD-10-CM | POA: Diagnosis not present

## 2015-09-16 DIAGNOSIS — C7951 Secondary malignant neoplasm of bone: Secondary | ICD-10-CM

## 2015-09-16 DIAGNOSIS — K469 Unspecified abdominal hernia without obstruction or gangrene: Secondary | ICD-10-CM | POA: Diagnosis not present

## 2015-09-16 DIAGNOSIS — K5660 Unspecified intestinal obstruction: Secondary | ICD-10-CM | POA: Insufficient documentation

## 2015-09-16 DIAGNOSIS — R000 Tachycardia, unspecified: Secondary | ICD-10-CM | POA: Diagnosis not present

## 2015-09-16 DIAGNOSIS — Z79899 Other long term (current) drug therapy: Secondary | ICD-10-CM | POA: Diagnosis not present

## 2015-09-16 DIAGNOSIS — C679 Malignant neoplasm of bladder, unspecified: Secondary | ICD-10-CM | POA: Diagnosis present

## 2015-09-16 DIAGNOSIS — F419 Anxiety disorder, unspecified: Secondary | ICD-10-CM | POA: Insufficient documentation

## 2015-09-16 DIAGNOSIS — K5730 Diverticulosis of large intestine without perforation or abscess without bleeding: Secondary | ICD-10-CM | POA: Insufficient documentation

## 2015-09-16 DIAGNOSIS — M545 Low back pain: Secondary | ICD-10-CM | POA: Insufficient documentation

## 2015-09-16 DIAGNOSIS — F17210 Nicotine dependence, cigarettes, uncomplicated: Secondary | ICD-10-CM | POA: Insufficient documentation

## 2015-09-16 DIAGNOSIS — E785 Hyperlipidemia, unspecified: Secondary | ICD-10-CM | POA: Diagnosis not present

## 2015-09-16 DIAGNOSIS — Z510 Encounter for antineoplastic radiation therapy: Secondary | ICD-10-CM | POA: Insufficient documentation

## 2015-09-16 DIAGNOSIS — R590 Localized enlarged lymph nodes: Secondary | ICD-10-CM | POA: Diagnosis not present

## 2015-09-16 DIAGNOSIS — C670 Malignant neoplasm of trigone of bladder: Secondary | ICD-10-CM

## 2015-09-16 HISTORY — DX: Malignant neoplasm of bladder, unspecified: C67.9

## 2015-09-16 NOTE — Addendum Note (Signed)
Encounter addended by: Heywood Footman, RN on: 09/16/2015 11:08 AM<BR>     Documentation filed: Vitals Section, Notes Section, Charges VN

## 2015-09-16 NOTE — Progress Notes (Signed)
Radiation Oncology         ([phone]) [phone] ________________________________  Initial Outpatient Consultation  Name: [name] MRN: [id number]  Date: 09/16/2015  DOB: March 24, 1961  [id number] D, MD  Lemmie Evens, MD   REFERRING PHYSICIAN: Lemmie Evens, MD  DIAGNOSIS: The primary encounter diagnosis was Spine metastasis [facility]). A diagnosis of Malignant neoplasm of urinary bladder, unspecified site [facility]) was also pertinent to this visit.    ICD-9-CM ICD-10-CM   1. 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 Malignant neoplasm of urinary bladder, unspecified site (HCC) 188.9 C67.9     HISTORY OF PRESENT ILLNESS:[name] is a 55 y.o. male with a long standing history of RA who has had increasing low back pain in the last month and a half. He presented to his primary care physician, Dr. Karie Kirks, complaining of bilateral leg and back pain plus weakness for a month and a half. He he has a history of high grade urothelial carcinoma of the bladder since 2015 and has been treated with 5 rounds of BCG bladder injections. His tumor has never invaded the muscularis of note. He states he had a cystoscopy earlier in the year and her believes this was normal. He does have plans to have another cystoscopy in May 2016.  As a result of his increasing back pain however,a lumbar spine MRI was performed on 3/15/217, revealing a tumor that extends from the right side of the vertebral body at L3 into the right psoas muscle, measuring about 4 cm in diameter. The tumor in the right pedicle of L5 encroaches upon the intervertebral foramina on the right at L4-5 and L5-S1 and could affect those exiting nerves.   He reports that he has back and leg pain. He is using Oxycodone to help alleviate this pain. Leafy Kindle, [facility] from rheumatology wanted him to do physical therapy for his back pain. His wife mentions that about a month and a half ago he wanted to call Sharyn Lull to set up another physical therapy  appointment because his pain was getting worse. He started using a cane then for stability. He denies nausea, vomiting, or chest pain. His wife mentions that his urine has a strong odor and is very dark. Denies hematuria. When he is sitting, he doesn't feel like he needs to use the rest room until he stands up. He denies nocturia since he is lying down when he sleeps. He mentions that he has some constipation that is attributed to taking pain medication. He denies trouble breathing. He has lost about 20 pounds in the last month and a half. His wife mentions that his appetite is poor. He mentions his left leg is bothering him more than his right leg. He thinks this is because he stretched a muscle in his left leg when he fell.   PREVIOUS RADIATION THERAPY: No  PAST MEDICAL HISTORY:  Past Medical History  Diagnosis Date  . Lesion of bladder   . Hematuria   . Tachycardia     PT UNSURE TYPE--  CONTROLLED W/ ATENOLOL (ALSO STATES HAD NORMAL STRESS TEST )  . Hyperlipidemia   . History of small bowel obstruction     2007 W/ SURGICAL INTERVENTION  . Sigmoid diverticulum   . Asymptomatic cholelithiasis   . Hernia, ventral     INCISIONAL  . Anxiety   . Arthritis   . Bladder cancer ([place])   . Bone cancer ([place])     PAST SURGICAL HISTORY: Past Surgical History  Procedure  Laterality Date  . Colonoscopy N/A 05/12/2013    Procedure: COLONOSCOPY;  Surgeon: Jamesetta So, MD;  Location: [facility];  Service: Gastroenterology;  Laterality: N/A;  . Exploratory laparotomy/  lysis adhesions/  meckel's diverticulectomy  11-02-2005    SBO  . Urinary tract surgery with suprapubic approach  AGE 28  . Abdominal hernia repair  2002  . Appendectomy  AGE 70  . Cysto/  urethral dilation  AGE 70  . Cystoscopy/retrograde/ureteroscopy N/A 08/31/2013    Procedure: CYSTOSCOPY/BLADDER BIOPSY /RETROGRADE/ TURBT;  Surgeon: Franchot Gallo, MD;  Location: [facility];  Service: Urology;  Laterality:  N/A;  . Cystoscopy with biopsy N/A 12/29/2013    Procedure: CYSTOSCOPY WITH BLADDER NECK BIOPSIES;  Surgeon: Jorja Loa, MD;  Location: [facility];  Service: Urology;  Laterality: N/A;    FAMILY HISTORY: family history is not on file.  SOCIAL HISTORY:  Social History   Social History  . Marital Status: Married    Spouse Name: N/A  . Number of Children: N/A  . Years of Education: N/A   Occupational History  . Not on file.   Social History Main Topics  . Smoking status: Current Every Day Smoker -- 1.50 packs/day for 30 years    Types: Cigarettes  . Smokeless tobacco: Never Used  . Alcohol Use: Yes     Comment: social  . Drug Use: No  . Sexual Activity: Not on file   Other Topics Concern  . Not on file   Social History Narrative  The patient is married and resides in [place]. He and his wife keep very young grandchildren often. He is on disability due to his bladder cancer and rheumatoid arthritis. He states that he recently quit smoking.  ALLERGIES: Enbrel  MEDICATIONS:  Current Outpatient Prescriptions  Medication Sig Dispense Refill  . ALPRAZolam (XANAX) 1 MG tablet Take 1 mg by mouth daily.    Marland Kitchen atenolol (TENORMIN) 50 MG tablet Take 50 mg by mouth every morning.     . folic acid (FOLVITE) 1 MG tablet     . Meth-Hyo-M Bl-Na Phos-Ph Sal (URIBEL) 118 MG CAPS Take 1 capsule (118 mg total) by mouth every 8 (eight) hours as needed. 20 capsule 0  . methotrexate 50 MG/2ML injection     . naproxen (NAPROSYN) 500 MG tablet Take 500 mg by mouth 2 (two) times daily as needed for moderate pain.    Marland Kitchen oxyCODONE-acetaminophen (PERCOCET) 10-325 MG tablet take 1/2 to 1 tablet UP TO four times a day for SEVERE PAIN  0  . cyanocobalamin 2000 MCG tablet Take 2,000 mcg by mouth daily. Reported on 09/16/2015    . gabapentin (NEURONTIN) 300 MG capsule Take 300 mg by mouth 3 (three) times daily. Reported on 09/16/2015  0   No current facility-administered medications for this encounter.     REVIEW OF SYSTEMS: On review of systems, the patient reports that he has been experiencing increasing low back pain on the right and pain radiating into his left thigh after a fall. He is not sure this is a muscle sprain or pole, but states that he has been experiencing this constantly requiring pain medication with oxycodone administration. He has become constipated because of the narcotic use, he denies any hematuria but states that he has episodes of urinary frequency when he stands, as well as malodorous change of his urine and concentration of his urine. His wife also states that he has been experiencing some more forgetfulness and does  not recall conversations that could've taken place 5 minutes prior. She states that she is concerned about possible spread of disease to the brain. She also questions whether or not a previous medication use for his rheumatoid arthritis called Orencia could have caused this cancer. He denies any blurred vision, double vision or headache. He denies abdominal pain, nausea, vomiting. He has lost about 20 pounds in the last month and a half. He is not intended for this. He denies any chest pain or shortness of breath, fevers, or chills. He denies any night sweats. A complete review of systems is obtained and is otherwise negative.   PHYSICAL EXAM:  height is 6\' 1"  (1.854 m) and weight is 226 lb (102.513 kg). His blood pressure is 114/57 and his pulse is 70. His respiration is 16 and oxygen saturation is 100%.   Pain scale 8/10 He presents today in a wheelchair. In general this is a well appearing male in no acute distress. He is alert and oriented x4 and appropriate throughout the examination. HEENT reveals that the patient is normocephalic, atraumatic. EOMs are intact. PERRLA. Skin is intact without any evidence of gross lesions. Cardiovascular exam reveals a regular rate and rhythm, no clicks rubs or murmurs are auscultated. Chest is clear to auscultation bilaterally.  Lymphatic assessment is performed and does not reveal any adenopathy in the cervical, supraclavicular, axillary, or inguinal chains. Abdomen has active bowel sounds in all quadrants and is intact. The abdomen is soft, non tender, non distended. Lower extremities are negative for pretibial pitting edema, deep calf tenderness, cyanosis or clubbing. Neuro exam reveals that cranial nerves II-XII are grossly intact. He has pain with dorsiflexion in the knee that radiates to his left thigh, this is the only component that limits his assessments. With palpation over the lumbar spine, he has localized pain around his L4-5 vertebral bodies.   KPS = 70  100 - Normal; no complaints; no evidence of disease. 90   - Able to carry on normal activity; minor signs or symptoms of disease. 80   - Normal activity with effort; some signs or symptoms of disease. 43   - Cares for self; unable to carry on normal activity or to do active work. 60   - Requires occasional assistance, but is able to care for most of his personal needs. 50   - Requires considerable assistance and frequent medical care. 17   - Disabled; requires special care and assistance. 55   - Severely disabled; hospital admission is indicated although death not imminent. 66   - Very sick; hospital admission necessary; active supportive treatment necessary. 10   - Moribund; fatal processes progressing rapidly. 0     - Dead  Karnofsky DA, Abelmann [place], Craver LS and Burchenal [facility] [phone]) The use of the nitrogen mustards in the palliative treatment of carcinoma: with particular reference to bronchogenic carcinoma Cancer 1 634-56  LABORATORY DATA:  Lab Results  Component Value Date   HGB 14.1 12/22/2013   HCT 40.8 12/22/2013   Lab Results  Component Value Date   NA 139 12/22/2013   K 4.3 12/22/2013   CL 101 12/22/2013   CO2 26 12/22/2013   No results found for: ALT, AST, GGT, ALKPHOS, BILITOT   RADIOGRAPHY: Dg Lumbar Spine Complete  09/14/2015   CLINICAL DATA:  Low back pain for 2 years. Occasional left sciatica. EXAM: LUMBAR SPINE - COMPLETE 4+ VIEW COMPARISON:  Report of radiographs dated 08/14/2013 and abdominal images dated 08/31/2013 and radiographs  dated 11/23/2005 and CT scan of the abdomen and pelvis dated 04/16/2013 the FINDINGS: There is abnormal lucency in the region of the right pedicle of L5. This appears to represent a change since the prior exams. The possibility of a focal destructive lesion should be considered. Slight facet arthritis at L4-5. No disc space narrowing or subluxation. Extensive calcification in the abdominal aorta and iliac arteries. IMPRESSION: Possible destructive lesion in the right pedicle of L5. This is not definitive but that area appears different than on multiple prior exams. I recommend MRI of the lumbar spine with and without contrast for further evaluation. Electronically Signed   By: Lorriane Shire M.D.   On: 09/14/2015 14:05   Mr Lumbar Spine W Wo Contrast  09/14/2015  CLINICAL DATA:  Personal history of bladder cancer. Low back pain, 2 years duration with left leg radiating pain. EXAM: MRI LUMBAR SPINE WITHOUT AND WITH CONTRAST TECHNIQUE: Multiplanar and multiecho pulse sequences of the lumbar spine were obtained without and with intravenous contrast. CONTRAST:  79mL MULTIHANCE GADOBENATE DIMEGLUMINE 529 MG/ML IV SOLN COMPARISON:  Radiography same day.  CT 04/16/2013. FINDINGS: There is widespread and extensive osseous metastatic disease. The examination covers the region from T9 through S3. There are foci of metastatic disease affecting every vertebral level. This is most advanced within the T10 vertebral body, the L3 vertebral body and the right side of the L5 vertebral body and posterior elements on the right as suggested at plain radiography. No extraosseous tumor, primary neural metastasis or nerve compression is demonstrated at L2 or above. At L3, tumor extends outward from the right side of vertebral  body into the psoas muscle. There is mild encroachment in the right extra foraminal region that could affect the right L3 nerve root. This tumor mass measures approximately 4 cm in diameter. At the L5 level, tumor on the right within the pedicle measures approximately 3 cm in diameter, and does encroach upon the intervertebral foramina on the right at both L4-5 and L5-S1 and could affect those nerves. There are numerous small retroperitoneal lymph nodes, most on the order of 1 cm in size consistent with metastatic nodal disease. IMPRESSION: Widespread osseous metastatic disease. No spinal canal encroachment. Tumor extends from the right side of the vertebral body at L3 into the right psoas muscle, measuring about 4 cm in diameter. Tumor in the right pedicle of L5 encroaches upon the intervertebral foramina on the right at L4-5 and L5-S1 and could affect those exiting nerves. Retroperitoneal lymphadenopathy. Electronically Signed   By: Nelson Chimes M.D.   On: 09/14/2015 20:04    IMPRESSION: Mr. Karman is a 55 yo male with malignant neoplasm of urinary bladder with new metastases concerning for either metastatic GU cancer versus a new primary. He is a good candidate for palliative radiation to his lumbar spine for pain relief.   PLAN: Dr. Tammi Klippel discussed the role of radiation as well as the risks, benefits, long and short term side effects. He recommends proceeding with 10 fractions to total 30 grays to the lumbar spine. As we're still trying to complete his workup, he recommended several imaging studies including a CT of the chest abdomen and pelvis, MRI of the brain, MRI of the thoracic spine, and discussed the need for possible biopsies to determine the primary origin of his cancer. This procedure has been fully reviewed with the patient and written informed consent has been obtained. He has a simulation appointment scheduled today. He will start treatment Monday, 09/19/2015.  The above documentation  reflects my direct findings during this shared patient visit. Please see the separate note by Dr. Tammi Klippel on this date for the remainder of the patient's plan of care.    Carola Rhine, PAC    This document serves as a record of services personally performed by Shona Simpson, PAC and Tyler Pita, MD. It was created on their behalf by Lendon Collar, a trained medical scribe. The creation of this record is based on the scribe's personal observations and the provider's statements to them. This document has been checked and approved by the attending provider.

## 2015-09-16 NOTE — Progress Notes (Signed)
See progress note under physician encounter. 

## 2015-09-16 NOTE — Progress Notes (Signed)
  Radiation Oncology         [phone] ________________________________  Name: [name] MRN: [id number]  Date: 09/16/2015  DOB: September 29, 1960  SIMULATION AND TREATMENT PLANNING NOTE    ICD-9-CM ICD-10-CM   1. 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 Left femoral neck lytic metastasis (HCC) 198.5 C79.51     DIAGNOSIS:  Bladder cancer with spine metastases  NARRATIVE:  The patient was brought to the [facility].  Identity was confirmed.  All relevant records and images related to the planned course of therapy were reviewed.  The patient freely provided informed written consent to proceed with treatment after reviewing the details related to the planned course of therapy. The consent form was witnessed and verified by the simulation staff.  Then, the patient was set-up in a stable reproducible  supine position for radiation therapy.  CT images were obtained.  Surface markings were placed.  The CT images were loaded into the planning software.  Then the target and avoidance structures were contoured.  Treatment planning then occurred.  The radiation prescription was entered and confirmed.  Then, I designed and supervised the construction of a total of 5 medically necessary complex treatment devices including 2 MLCs for lumbar spine, 2 MLCs for hip, and BodyFix positioner.  I have requested : Isodose Plan.  I have ordered:Nutrition Consult  CT showed a lytic left femoral neck metastasis. 2 MLCs were created to cover that with radiation also, pending diagnostic imaging and ortho consult.  PLAN:  The patient will receive 30 Gy in 10 fractions.  ________________________________  Sheral Apley Tammi Klippel, M.D.    This document serves as a record of services personally performed by Tyler Pita, MD. It was created on his behalf by Lendon Collar, a trained medical scribe. The creation of this record is based on the scribe's personal observations and the provider's statements to  them. This document has been checked and approved by the attending provider.

## 2015-09-16 NOTE — Telephone Encounter (Signed)
CALLED PATIENT TO INFORM OF MRI AND CT, LVM FOR A RETURN CALL

## 2015-09-16 NOTE — Telephone Encounter (Signed)
Per Dr. Johny Shears order phoned patient. Spoke with wife. Explained there is an area of concern in the left femur discovered on during CT SIM. Encouraged her to have him limit weight bearing activity by using a cane or walker. She verbalized understanding.

## 2015-09-19 ENCOUNTER — Ambulatory Visit
Admission: RE | Admit: 2015-09-19 | Discharge: 2015-09-19 | Disposition: A | Discharge status: Home / Self Care | Payer: Federal, State, Local not specified - PPO | Source: Ambulatory Visit | Attending: Radiation Oncology | Admitting: Radiation Oncology

## 2015-09-19 ENCOUNTER — Telehealth: Payer: Self-pay | Admitting: Radiation Oncology

## 2015-09-19 DIAGNOSIS — M5442 Lumbago with sciatica, left side: Secondary | ICD-10-CM

## 2015-09-19 DIAGNOSIS — C7951 Secondary malignant neoplasm of bone: Secondary | ICD-10-CM

## 2015-09-19 DIAGNOSIS — Z510 Encounter for antineoplastic radiation therapy: Secondary | ICD-10-CM | POA: Diagnosis not present

## 2015-09-19 MED ORDER — MORPHINE SULFATE ER 30 MG PO TBCR: 30.0000 mg | EXTENDED_RELEASE_TABLET | Freq: Two times a day (BID) | ORAL | Status: AC

## 2015-09-19 NOTE — Telephone Encounter (Signed)
S of the patient's wife this morning, and reviewed by Dr. Tammi Klippel and I contacted atraumatic orthopedic surgeon, Dr. Marcelino Scot. Dr. Marcelino Scot recommended considering surgical stability with a traumatic hip replacement. He will be out of the office in the next 2 weeks, and if surgery is considered, he would recommend proceeding with care at a academic institution such as [facility], [place] her [place]. I explained this to the patient's wife, and she states that they will discuss this but at this point they plan to move forward with low weightbearing activity, and radiotherapy alone until we know more information regarding his disease process. She states that he also has noticed N lump under the skin of his scalp and is very concerned about whether this is cancerous as well. He will be having an MRI of the brain, as well as a CT of the chest abdomen and pelvis, and thoracic MRI within the next week. He plans to proceed with his first treatment to the spine and left femur today. His wife also discussed that he has been using oxycodone for pain relief without significant improvement of his hip pain and low back pain. He is currently receiving 10 mg per 325 from his primary provider, and is using this at least 5 times per day. We discussed the use of a longer acting pain reliever in addition to the short acting, and he provided a prescription for MS Contin 30 mg tablets to be taken 1 in the morning and 1 in the evening. I advised his wife that we would expect much less use of the oxycodone for breakthrough relief. She states agreement and understanding. A new prescription is provided and will be taken to the treatment machine for the patient today. We did review the risks, benefits and side effect profile of this new medication. She states agreement and understanding and will communicate this as well with her husband.

## 2015-09-20 ENCOUNTER — Telehealth: Payer: Self-pay | Admitting: Radiation Oncology

## 2015-09-20 ENCOUNTER — Ambulatory Visit
Admission: RE | Admit: 2015-09-20 | Discharge: 2015-09-20 | Disposition: A | Discharge status: Home / Self Care | Payer: Federal, State, Local not specified - PPO | Source: Ambulatory Visit | Attending: Radiation Oncology | Admitting: Radiation Oncology

## 2015-09-20 DIAGNOSIS — Z510 Encounter for antineoplastic radiation therapy: Secondary | ICD-10-CM | POA: Diagnosis not present

## 2015-09-20 NOTE — Telephone Encounter (Signed)
Place three packets of FMLA and benefit paperwork in [name]' inbox for signature.

## 2015-09-21 ENCOUNTER — Ambulatory Visit
Admission: RE | Admit: 2015-09-21 | Discharge: 2015-09-21 | Disposition: A | Discharge status: Home / Self Care | Payer: Federal, State, Local not specified - PPO | Source: Ambulatory Visit | Attending: Radiation Oncology | Admitting: Radiation Oncology

## 2015-09-21 DIAGNOSIS — Z510 Encounter for antineoplastic radiation therapy: Secondary | ICD-10-CM | POA: Diagnosis not present

## 2015-09-22 ENCOUNTER — Ambulatory Visit
Admission: RE | Admit: 2015-09-22 | Discharge: 2015-09-22 | Disposition: A | Discharge status: Home / Self Care | Payer: Federal, State, Local not specified - PPO | Source: Ambulatory Visit | Attending: Radiation Oncology | Admitting: Radiation Oncology

## 2015-09-22 DIAGNOSIS — Z510 Encounter for antineoplastic radiation therapy: Secondary | ICD-10-CM | POA: Diagnosis not present

## 2015-09-23 ENCOUNTER — Ambulatory Visit
Admission: RE | Admit: 2015-09-23 | Discharge: 2015-09-23 | Disposition: A | Discharge status: Home / Self Care | Payer: Federal, State, Local not specified - PPO | Source: Ambulatory Visit | Attending: Radiation Oncology | Admitting: Radiation Oncology

## 2015-09-23 ENCOUNTER — Encounter: Payer: Self-pay | Admitting: Radiation Oncology

## 2015-09-23 ENCOUNTER — Ambulatory Visit (HOSPITAL_COMMUNITY)
Admission: RE | Admit: 2015-09-23 | Discharge: 2015-09-23 | Disposition: A | Discharge status: Home / Self Care | Payer: Federal, State, Local not specified - PPO | Source: Ambulatory Visit | Attending: Radiation Oncology | Admitting: Radiation Oncology

## 2015-09-23 ENCOUNTER — Encounter (HOSPITAL_COMMUNITY): Payer: Self-pay

## 2015-09-23 ENCOUNTER — Other Ambulatory Visit: Payer: Self-pay | Admitting: Radiation Oncology

## 2015-09-23 VITALS — BP 117/64 | HR 78 | Temp 97.8°F | Ht 73.0 in | Wt 215.5 lb

## 2015-09-23 DIAGNOSIS — Z510 Encounter for antineoplastic radiation therapy: Secondary | ICD-10-CM | POA: Diagnosis not present

## 2015-09-23 DIAGNOSIS — C7951 Secondary malignant neoplasm of bone: Secondary | ICD-10-CM | POA: Diagnosis not present

## 2015-09-23 DIAGNOSIS — C670 Malignant neoplasm of trigone of bladder: Secondary | ICD-10-CM | POA: Insufficient documentation

## 2015-09-23 MED ORDER — PROMETHAZINE HCL 25 MG PO TABS: 25.0000 mg | ORAL_TABLET | Freq: Four times a day (QID) | ORAL | Status: AC | PRN

## 2015-09-23 MED ORDER — IOPAMIDOL (ISOVUE-300) INJECTION 61%
150.0000 mL | Freq: Once | INTRAVENOUS | Status: AC | PRN
  Administered 2015-09-23: 125 mL via INTRAVENOUS

## 2015-09-23 NOTE — Progress Notes (Addendum)
[name] is here for his 5th fraction of radiation to his Left Femur, Spine. He rates his pain a 20/10 to his Left Femur and Lower back. He is taking MSContin 30 mg at bedtime only. I have encouraged them to take the MSContin every 12 hours and explained the long acting purpose of the medicine. He is taking  1 Percocet about every four hours during the day to help relieve his pain. He reports a "knot" on the Left Side of his forehead that has recently appeared. He reports some fatigue. He is not eating very well per his wife's report. He has declined Boost and Carnation instant breakfast, but states he will try. He reports constipation and has not had a bowel movement in 4 days. He has taking duccolax in the past, but has not taking any recently.   BP 117/64 mmHg  Pulse 78  Temp(Src) 97.8 F (36.6 C)  Ht 6\' 1"  (1.854 m)  Wt 215 lb 8 oz (97.75 kg)  BMI 28.44 kg/m2  SpO2 97%   Wt Readings from Last 3 Encounters:  09/23/15 215 lb 8 oz (97.75 kg)  09/15/15 227 lb 14.4 oz (103.375 kg)  09/14/15 227 lb (102.967 kg)

## 2015-09-23 NOTE — Progress Notes (Addendum)
   Weekly Management Note:  Outpatient    ICD-9-CM ICD-10-CM   1. Left femoral neck lytic metastasis (HCC) 198.5 C79.51 Ambulatory referral to Nutrition and Diabetic Education     promethazine (PHENERGAN) 25 MG tablet  2. Spine metastasis ([place]) 198.5 C79.51 Ambulatory referral to Nutrition and Diabetic Education     promethazine (PHENERGAN) 25 MG tablet    Current Dose:  15 Gy  Projected Dose: 30 Gy   Narrative:  The patient presents for routine under treatment assessment.  CBCT/MVCT images/Port film x-rays were reviewed.  The chart was checked.   Mr. Hegewald is here for his 5th fraction of radiation to his Left Femur, Spine. He rates his pain a 20/10 to his Left Femur and Lower back. He is taking MSContin 30 mg at bedtime only.  He is taking 1 Percocet about every four hours during the day to help relieve his pain.  He reports some fatigue. He is not eating very well per his wife's report. He has declined Boost and Carnation instant breakfast, but states he will try. Reports nausea. He reports constipation and has not had a bowel movement in 4 days. He has taking duccolax in the past, but has not taking any recently.  Drinking a lot of water.  Physical Findings:  height is 6\' 1"  (1.854 m) and weight is 215 lb 8 oz (97.75 kg). His temperature is 97.8 F (36.6 C). His blood pressure is 117/64 and his pulse is 78. His oxygen saturation is 97%.  orthostatics obtained - negative. Wt Readings from Last 3 Encounters:  09/23/15 215 lb 8 oz (97.75 kg)  09/15/15 227 lb 14.4 oz (103.375 kg)  09/14/15 227 lb (102.967 kg)   In pain.  In wheelchair  Impression:  The patient is tolerating radiotherapy.  Plan:  Continue radiotherapy as planned. I have encouraged him to take the MSContin every 12 hours and explained the long acting purpose of the medicine. Dulcolax PRN constipation. Or, try Miralax.  Phenergan Rx today for nausea. Refer to nutritionist for weight  loss.   ________________________________   Eppie Gibson, M.D.     This document serves as a record of services personally performed by Eppie Gibson, MD. It was created on her behalf by Arlyce Harman, a trained medical scribe. The creation of this record is based on the scribe's personal observations and the provider's statements to them. This document has been checked and approved by the attending provider.

## 2015-09-26 ENCOUNTER — Ambulatory Visit
Admission: RE | Admit: 2015-09-26 | Discharge: 2015-09-26 | Disposition: A | Discharge status: Home / Self Care | Payer: Federal, State, Local not specified - PPO | Source: Ambulatory Visit | Attending: Radiation Oncology | Admitting: Radiation Oncology

## 2015-09-26 ENCOUNTER — Telehealth: Payer: Self-pay | Admitting: Radiation Oncology

## 2015-09-26 ENCOUNTER — Ambulatory Visit (HOSPITAL_COMMUNITY)
Admission: RE | Admit: 2015-09-26 | Discharge: 2015-09-26 | Disposition: A | Discharge status: Home / Self Care | Payer: Federal, State, Local not specified - PPO | Source: Ambulatory Visit | Attending: Radiation Oncology | Admitting: Radiation Oncology

## 2015-09-26 DIAGNOSIS — Z510 Encounter for antineoplastic radiation therapy: Secondary | ICD-10-CM | POA: Diagnosis not present

## 2015-09-26 DIAGNOSIS — C7951 Secondary malignant neoplasm of bone: Secondary | ICD-10-CM

## 2015-09-26 DIAGNOSIS — C787 Secondary malignant neoplasm of liver and intrahepatic bile duct: Secondary | ICD-10-CM | POA: Diagnosis not present

## 2015-09-26 DIAGNOSIS — G3189 Other specified degenerative diseases of nervous system: Secondary | ICD-10-CM | POA: Insufficient documentation

## 2015-09-26 DIAGNOSIS — C670 Malignant neoplasm of trigone of bladder: Secondary | ICD-10-CM | POA: Insufficient documentation

## 2015-09-26 DIAGNOSIS — R9402 Abnormal brain scan: Secondary | ICD-10-CM | POA: Diagnosis not present

## 2015-09-26 DIAGNOSIS — R918 Other nonspecific abnormal finding of lung field: Secondary | ICD-10-CM | POA: Insufficient documentation

## 2015-09-26 MED ORDER — GADOBENATE DIMEGLUMINE 529 MG/ML IV SOLN
20.0000 mL | Freq: Once | INTRAVENOUS | Status: AC | PRN
  Administered 2015-09-26: 20 mL via INTRAVENOUS

## 2015-09-26 NOTE — Telephone Encounter (Signed)
Phoned patient's wife, Santiago Glad, per Shona Simpson' direction. Explained CT results were obtained today. Went onto [facility] and Dr. Tammi Klippel plan to "put their heads together and create a plan" then, Bryson Ha plans to call her with that plan. Wife verbalized understanding and expressed appreciation for the call.

## 2015-09-27 ENCOUNTER — Telehealth: Payer: Self-pay | Admitting: Radiation Oncology

## 2015-09-27 ENCOUNTER — Ambulatory Visit
Admission: RE | Admit: 2015-09-27 | Discharge: 2015-09-27 | Disposition: A | Discharge status: Home / Self Care | Payer: Federal, State, Local not specified - PPO | Source: Ambulatory Visit | Attending: Radiation Oncology | Admitting: Radiation Oncology

## 2015-09-27 ENCOUNTER — Other Ambulatory Visit: Payer: Self-pay | Admitting: Radiation Oncology

## 2015-09-27 DIAGNOSIS — C7951 Secondary malignant neoplasm of bone: Secondary | ICD-10-CM

## 2015-09-27 DIAGNOSIS — C787 Secondary malignant neoplasm of liver and intrahepatic bile duct: Secondary | ICD-10-CM

## 2015-09-27 DIAGNOSIS — C670 Malignant neoplasm of trigone of bladder: Secondary | ICD-10-CM

## 2015-09-27 DIAGNOSIS — Z510 Encounter for antineoplastic radiation therapy: Secondary | ICD-10-CM | POA: Diagnosis not present

## 2015-09-27 MED ORDER — MORPHINE SULFATE ER 15 MG PO TBCR: 15.0000 mg | EXTENDED_RELEASE_TABLET | Freq: Two times a day (BID) | ORAL | Status: DC

## 2015-09-27 NOTE — Progress Notes (Signed)
Radiation Oncology         [phone]   Name: [name] MRN: [id number]   Date: 09/27/2015  DOB: [birth year]   Radiation Oncology Work In Visit    ICD-9-CM ICD-10-CM   1. Left femoral neck lytic metastasis (HCC) 198.5 C79.51 CT guided needle placement  2. Spine metastasis (HCC) 198.5 C79.51 CT guided needle placement  3. 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 Also since his last visit, he has undergone a CT scan of the chest abdomen and pelvis, thoracic MRI, an MRI of the brain. Unfortunately multiple areas of [name] changes are present including the liver, retroperitoneal adenopathy, left inguinal adenopathy, diffuse vertebral disease in the thoracic spine, and metastatic disease to the skull.   The patient is currently taking MS Contin 30 mg twice a day, and 10 g strength oxycodone with Tylenol for breakthrough relief. Despite this, the patient continues to take 4 tablets of Percocet a day. He does not feel as though his pain as well relieved later in the day. He continues to have hip pain, and low back as well as thoracic spine discomfort. He denies any nausea or vomiting but states that his appetite has been poor. He is trying to increase his intake of protein rich supplements such as ensure, boost, and carnation breakfast essentials. He denies any fevers or chills, chest pain or shortness of breath. He has not fallen at home. He continues to have sensation in his lower extremities, upper extremities, and is fully continent of bowel and bladder   Physical Findings  vitals were not taken for this visit.  In general this is a chronically ill-appearing Caucasian male in no acute distress. He is alert and oriented 4 and appropriate throughout the examination. There are 2 areas of palpable anomaly  along his frontal and temporal region of the skull consistent with the lesion seen on his MRI. No skin breakdown or ulceration is noted through the skin. The patient is somewhat lethargic but alert and oriented 4 and appropriate throughout the examination. Cardiopulmonary assessment reveals a normal effort without evidence of acute distress.   Impression/Plan: 1. Metastatic disease to multiple osseous sites, and diffuse metastatic disease to the liver, with retroperitoneal and inguinal adenopathy. Dr. Tammi Klippel and I have reviewed his images, I will further discuss the position of treatment to the thoracic spine with Dr. Tammi Klippel. The patient will return on Thursday for further discussion of this as well. He does not have any symptoms or radiographic findings of cord compression at this time and we have encouraged him to keep [place] informed of any symptoms that acutely worsened. In the meantime, Dr. Tammi Klippel I have discussed that further workup of his metastatic disease is indicated as his disease pattern does not mimic his history of confined bladder cancer. I will contact interventional radiology and place a referral for a CT-guided biopsy. We will determine the location of this biopsy was radiology has been able to review his films. Once we can confirm the original site of his disease, we will move forward with a stat referral to medical oncology for systemic treatment. 2. Back pain secondary to #1. The patient continues to take MS Contin 30 mg twice daily. I encouraged him to consider increasing this to 45 mg twice daily. The patient states he hasn't  Percocet at home for breakthrough relief. It does not improve, we will consider switching his long-acting agent  To fentanyl. 3. Goals of care. The patient and his wife verbalized the concern for his overall prognosis which again at this point in time is unknown because of the unknown origin of his cancer. We discussed the rationale for meeting with palliative care  was more information has been collected for further discussion. They state agreement and understanding.        Carola Rhine, PAC

## 2015-09-27 NOTE — Telephone Encounter (Signed)
I spoke with the patient's wife about the recommendations from interventional radiology to proceed with ultrasound-guided biopsy of the patient's liver. She will communicate this with her husband. This order was placed and interventional  we'll be contacting the patient.

## 2015-09-28 ENCOUNTER — Other Ambulatory Visit: Payer: Self-pay | Admitting: Radiology

## 2015-09-28 ENCOUNTER — Ambulatory Visit
Admission: RE | Admit: 2015-09-28 | Discharge: 2015-09-28 | Disposition: A | Discharge status: Home / Self Care | Payer: Federal, State, Local not specified - PPO | Source: Ambulatory Visit | Attending: Radiation Oncology | Admitting: Radiation Oncology

## 2015-09-28 DIAGNOSIS — Z510 Encounter for antineoplastic radiation therapy: Secondary | ICD-10-CM | POA: Diagnosis not present

## 2015-09-29 ENCOUNTER — Encounter: Payer: Self-pay | Admitting: Radiation Oncology

## 2015-09-29 ENCOUNTER — Ambulatory Visit
Admission: RE | Admit: 2015-09-29 | Discharge: 2015-09-29 | Disposition: A | Discharge status: Home / Self Care | Payer: Federal, State, Local not specified - PPO | Source: Ambulatory Visit | Attending: Radiation Oncology | Admitting: Radiation Oncology

## 2015-09-29 ENCOUNTER — Encounter: Payer: Federal, State, Local not specified - PPO | Admitting: Nutrition

## 2015-09-29 ENCOUNTER — Encounter: Payer: Self-pay | Admitting: Nutrition

## 2015-09-29 VITALS — BP 97/53 | HR 79 | Resp 16

## 2015-09-29 DIAGNOSIS — Z510 Encounter for antineoplastic radiation therapy: Secondary | ICD-10-CM | POA: Diagnosis not present

## 2015-09-29 DIAGNOSIS — C7951 Secondary malignant neoplasm of bone: Secondary | ICD-10-CM

## 2015-09-29 NOTE — Patient Instructions (Signed)
NPO after MN, am meds with sips needs driver,

## 2015-09-29 NOTE — Progress Notes (Signed)
Unable to obtain weight because patient shakes while standing. BP low. Heart rate normal. Reports low back and left leg pain 7 on a scale of 0-10. Reports he hasn't had a bowel movement in four days. Wife plans to pick up suppository on the way home today. Patient diaphoretic. Wife reports the patient drinks water often but, will not eat. Patient weak and fatigued.   BP 97/53 mmHg  Pulse 79  Resp 16  Wt   SpO2 100% Wt Readings from Last 3 Encounters:  09/23/15 215 lb 8 oz (97.75 kg)  09/15/15 227 lb 14.4 oz (103.375 kg)  09/14/15 227 lb (102.967 kg)

## 2015-09-29 NOTE — Progress Notes (Signed)
  Radiation Oncology         [phone]   Name: [name] MRN: [id number]   Date: 09/29/2015  DOB: October 21, 1960   Weekly Radiation Therapy Management    ICD-9-CM ICD-10-CM   1. Left femoral neck lytic metastasis (HCC) 198.5 C79.51     Current Dose: 27 Gy  Planned Dose:  30 Gy  Narrative The patient presents for routine under treatment assessment.  Unable to obtain weight because patient shakes while standing. BP low. Heart rate normal. Reports low back and left leg pain 7 on a scale of 0-10. Reports he hasn't had a bowel movement in four days. Wife plans to pick up suppository on the way home today. Patient diaphoretic. Wife reports the patient drinks water often but, will not eat. Patient weak and fatigued.   The patient is without complaint. Set-up films were reviewed. The chart was checked.  Physical Findings  blood pressure is 97/53 and his pulse is 79. His respiration is 16 and oxygen saturation is 100%. . Weight essentially stable.  No significant changes.  Impression The patient is tolerating radiation. We reviewed his MRI of the brain and spine from 09/26/2015. He has a new lesion on his T5 vertebrae.  Plan Continue treatment as planned. He has a CT-guided biopsy tomorrow. He finished treatment Monday. He has a follow up appointment scheduled 11/03/2015. We discussed the possibility of a hip fracture and to be careful when walking. We will follow up with him after the biopsy next week.         Sheral Apley Tammi Klippel, M.D.    This document serves as a record of services personally performed by Tyler Pita, MD. It was created on his behalf by Lendon Collar, a trained medical scribe. The creation of this record is based on the scribe's personal observations and the provider's statements to them. This document has been checked and approved by the attending provider.

## 2015-09-29 NOTE — Progress Notes (Signed)
Patient did not cancel or show up for nutrition appointment. 

## 2015-09-30 ENCOUNTER — Ambulatory Visit (HOSPITAL_COMMUNITY)
Admission: RE | Admit: 2015-09-30 | Discharge: 2015-09-30 | Disposition: A | Discharge status: Home / Self Care | Payer: Federal, State, Local not specified - PPO | Source: Ambulatory Visit | Attending: Radiation Oncology | Admitting: Radiation Oncology

## 2015-09-30 ENCOUNTER — Ambulatory Visit: Payer: Federal, State, Local not specified - PPO

## 2015-09-30 ENCOUNTER — Encounter (HOSPITAL_COMMUNITY): Payer: Self-pay

## 2015-09-30 ENCOUNTER — Ambulatory Visit
Admission: RE | Admit: 2015-09-30 | Discharge: 2015-09-30 | Disposition: A | Discharge status: Home / Self Care | Payer: Federal, State, Local not specified - PPO | Source: Ambulatory Visit | Attending: Radiation Oncology | Admitting: Radiation Oncology

## 2015-09-30 ENCOUNTER — Other Ambulatory Visit: Payer: Self-pay | Admitting: Radiology

## 2015-09-30 DIAGNOSIS — C670 Malignant neoplasm of trigone of bladder: Secondary | ICD-10-CM

## 2015-09-30 DIAGNOSIS — C787 Secondary malignant neoplasm of liver and intrahepatic bile duct: Secondary | ICD-10-CM

## 2015-09-30 DIAGNOSIS — Z87891 Personal history of nicotine dependence: Secondary | ICD-10-CM | POA: Diagnosis not present

## 2015-09-30 DIAGNOSIS — F419 Anxiety disorder, unspecified: Secondary | ICD-10-CM | POA: Diagnosis not present

## 2015-09-30 DIAGNOSIS — E785 Hyperlipidemia, unspecified: Secondary | ICD-10-CM | POA: Diagnosis not present

## 2015-09-30 DIAGNOSIS — C7951 Secondary malignant neoplasm of bone: Secondary | ICD-10-CM | POA: Insufficient documentation

## 2015-09-30 DIAGNOSIS — Z79899 Other long term (current) drug therapy: Secondary | ICD-10-CM | POA: Insufficient documentation

## 2015-09-30 DIAGNOSIS — Z8551 Personal history of malignant neoplasm of bladder: Secondary | ICD-10-CM | POA: Diagnosis not present

## 2015-09-30 DIAGNOSIS — C801 Malignant (primary) neoplasm, unspecified: Secondary | ICD-10-CM | POA: Insufficient documentation

## 2015-09-30 LAB — CBC
HCT: 38.7 % — ABNORMAL LOW (ref 39.0–52.0)
HEMOGLOBIN: 12.3 g/dL — AB (ref 13.0–17.0)
MCH: 26.9 pg (ref 26.0–34.0)
MCHC: 31.8 g/dL (ref 30.0–36.0)
MCV: 84.7 fL (ref 78.0–100.0)
Platelets: 212 10*3/uL (ref 150–400)
RBC: 4.57 MIL/uL (ref 4.22–5.81)
RDW: 14.5 % (ref 11.5–15.5)
WBC: 10.1 10*3/uL (ref 4.0–10.5)

## 2015-09-30 LAB — PROTIME-INR
INR: 1.2 (ref 0.00–1.49)
Prothrombin Time: 15.3 s — ABNORMAL HIGH (ref 11.6–15.2)

## 2015-09-30 LAB — APTT: APTT: 31 s (ref 24–37)

## 2015-09-30 MED ORDER — GELATIN ABSORBABLE 12-7 MM EX MISC
CUTANEOUS | Status: AC
  Filled 2015-09-30: qty 1

## 2015-09-30 MED ORDER — LIDOCAINE HCL 1 % IJ SOLN
INTRAMUSCULAR | Status: AC
  Filled 2015-09-30: qty 20

## 2015-09-30 MED ORDER — MIDAZOLAM HCL 2 MG/2ML IJ SOLN
INTRAMUSCULAR | Status: AC | PRN
  Administered 2015-09-30: 1 mg via INTRAVENOUS

## 2015-09-30 MED ORDER — SODIUM CHLORIDE 0.9 % IV SOLN: Freq: Once | INTRAVENOUS | Status: DC

## 2015-09-30 MED ORDER — MIDAZOLAM HCL 2 MG/2ML IJ SOLN
INTRAMUSCULAR | Status: AC
  Filled 2015-09-30: qty 4

## 2015-09-30 MED ORDER — FENTANYL CITRATE (PF) 100 MCG/2ML IJ SOLN
50.0000 ug | Freq: Once | INTRAMUSCULAR | Status: AC
  Administered 2015-09-30: 50 ug via INTRAVENOUS

## 2015-09-30 MED ORDER — FENTANYL CITRATE (PF) 100 MCG/2ML IJ SOLN
INTRAMUSCULAR | Status: AC
  Administered 2015-09-30: 50 ug via INTRAVENOUS
  Filled 2015-09-30: qty 4

## 2015-09-30 MED ORDER — MORPHINE SULFATE (PF) 2 MG/ML IV SOLN: 2.0000 mg | INTRAVENOUS | Status: DC | PRN

## 2015-09-30 MED ORDER — HYDROCODONE-ACETAMINOPHEN 5-325 MG PO TABS
ORAL_TABLET | ORAL | Status: AC
  Filled 2015-09-30: qty 2

## 2015-09-30 MED ORDER — HYDROCODONE-ACETAMINOPHEN 5-325 MG PO TABS
1.0000 | ORAL_TABLET | ORAL | Status: DC | PRN
  Administered 2015-09-30: 2 via ORAL

## 2015-09-30 MED ORDER — FENTANYL CITRATE (PF) 100 MCG/2ML IJ SOLN
INTRAMUSCULAR | Status: AC | PRN
  Administered 2015-09-30: 50 ug via INTRAVENOUS

## 2015-09-30 MED ORDER — SODIUM CHLORIDE 0.9 % IV SOLN
INTRAVENOUS | Status: AC | PRN
  Administered 2015-09-30: 10 mL/h via INTRAVENOUS

## 2015-09-30 NOTE — H&P (Signed)
Chief Complaint: Patient was seen in consultation today for left paraspinal mass at the request of Hayden Pedro  Referring Physician(s): Hayden Pedro  Supervising Physician: Daryll Brod  History of Present Illness: [name] is a 55 y.o. male    Pt diagnosed with Urinary bladder cancer 2 yrs ago Developed worsening back pain for weeks (pt has hx arthritis---thought pain was from this) MD work up includes xrays; CT; MRI and MR brain Widespread disease Brain mets  MRI Lumbar: IMPRESSION: Widespread osseous metastatic disease. No spinal canal encroachment. Tumor extends from the right side of the vertebral body at L3 into the right psoas muscle, measuring about 4 cm in diameter. Tumor in the right pedicle of L5 encroaches upon the intervertebral foramina on the right at L4-5 and L5-S1 and could affect those exiting nerves.  Retroperitoneal lymphadenopathy.  CT CAP: IMPRESSION: Other than bony disease, there is no evidence of metastatic disease in the thorax.  Metastatic disease in the thoracic spine is seen at multiple levels. At T5, there is a posterior vertebral body lesion with cortical breakthrough and epidural tumor  Extensive metastatic disease throughout the liver. Nearly the entire right lobe is occupied by tumor  Abnormal left para-aortic and left iliac adenopathy  Widespread metastatic disease in the lumbar spine, pelvis, and left femoral neck as described  Now scheduled for biopsy of L 3 paraspinal mass biopsy  Past Medical History  Diagnosis Date  . Lesion of bladder   . Hematuria   . Tachycardia     PT UNSURE TYPE--  CONTROLLED W/ ATENOLOL (ALSO STATES HAD NORMAL STRESS TEST )  . Hyperlipidemia   . History of small bowel obstruction     2007 W/ SURGICAL INTERVENTION  . Sigmoid diverticulum   . Asymptomatic cholelithiasis   . Hernia, ventral     INCISIONAL  . Anxiety   . Arthritis   . Bladder cancer ([place])  dx'd 2015  . Bone cancer ([place]) dx'd 08/2015    metastatic dz    Past Surgical History  Procedure Laterality Date  . Colonoscopy N/A 05/12/2013    Procedure: COLONOSCOPY;  Surgeon: Jamesetta So, MD;  Location: [facility];  Service: Gastroenterology;  Laterality: N/A;  . Exploratory laparotomy/  lysis adhesions/  meckel's diverticulectomy  11-02-2005    SBO  . Urinary tract surgery with suprapubic approach  AGE 51  . Abdominal hernia repair  2002  . Appendectomy  AGE 76  . Cysto/  urethral dilation  AGE 76  . Cystoscopy/retrograde/ureteroscopy N/A 08/31/2013    Procedure: CYSTOSCOPY/BLADDER BIOPSY /RETROGRADE/ TURBT;  Surgeon: Franchot Gallo, MD;  Location: [facility];  Service: Urology;  Laterality: N/A;  . Cystoscopy with biopsy N/A 12/29/2013    Procedure: CYSTOSCOPY WITH BLADDER NECK BIOPSIES;  Surgeon: Jorja Loa, MD;  Location: [facility];  Service: Urology;  Laterality: N/A;    Allergies: Enbrel  Medications: Prior to Admission medications   Medication Sig Start Date End Date Taking? Authorizing Provider  ALPRAZolam Duanne Moron) 1 MG tablet Take 1 mg by mouth daily.   Yes Historical Provider, MD  atenolol (TENORMIN) 50 MG tablet Take 50 mg by mouth every morning.    Yes Historical Provider, MD  morphine (MS CONTIN) 15 MG 12 hr tablet Take 1 tablet (15 mg total) by mouth every 12 (twelve) hours. 09/27/15  Yes Hayden Pedro, PA-C  morphine (MS CONTIN) 30 MG 12 hr tablet Take 1 tablet (30 mg total) by mouth every 12 (  twelve) hours. 09/19/15  Yes Hayden Pedro, PA-C  naproxen (NAPROSYN) 500 MG tablet Take 500 mg by mouth 2 (two) times daily as needed for moderate pain. Reported on 09/23/2015   Yes Historical Provider, MD  oxyCODONE-acetaminophen (PERCOCET) 10-325 MG tablet take 1/2 to 1 tablet UP TO four times a day for SEVERE PAIN 09/14/15  Yes Historical Provider, MD  promethazine (PHENERGAN) 25 MG tablet Take 1 tablet (25 mg total) by mouth every 6  (six) hours as needed for nausea or vomiting. 09/23/15  Yes Eppie Gibson, MD     Family History  Problem Relation Age of Onset  . Cancer Mother     esophageal    Social History   Social History  . Marital Status: Married    Spouse Name: N/A  . Number of Children: N/A  . Years of Education: N/A   Social History Main Topics  . Smoking status: Former Smoker -- 1.50 packs/day for 30 years    Types: Cigarettes  . Smokeless tobacco: Never Used  . Alcohol Use: 0.0 oz/week    0 Standard drinks or equivalent per week     Comment: social  . Drug Use: No  . Sexual Activity: Not Currently   Other Topics Concern  . None   Social History Narrative     Review of Systems: A 12 point ROS discussed and pertinent positives are indicated in the HPI above.  All other systems are negative.  Review of Systems  Constitutional: Positive for diaphoresis, activity change, appetite change and fatigue. Negative for fever.  Eyes: Negative for visual disturbance.  Respiratory: Negative for shortness of breath.   Musculoskeletal: Positive for back pain and gait problem.  Neurological: Positive for weakness and headaches.  Psychiatric/Behavioral: Negative for behavioral problems and confusion.    Vital Signs: BP 128/75 mmHg  Pulse 93  Temp(Src) 97.9 F (36.6 C)  Resp 18  Ht 6\' 1"  (1.854 m)  Wt 215 lb (97.523 kg)  BMI 28.37 kg/m2  SpO2 95%  Physical Exam  Constitutional: He is oriented to person, place, and time.  Cardiovascular: Normal rate, regular rhythm and normal heart sounds.   Pulmonary/Chest: Effort normal and breath sounds normal.  Abdominal: Soft. Bowel sounds are normal.  Musculoskeletal: Normal range of motion.  Weak   Neurological: He is alert and oriented to person, place, and time.  Skin: Skin is warm and dry.  Psychiatric: He has a normal mood and affect. His behavior is normal. Thought content normal.  Nursing note and vitals reviewed.   Mallampati Score:  MD  Evaluation Airway: WNL Heart: WNL Abdomen: WNL Chest/ Lungs: WNL ASA  Classification: 3 Mallampati/Airway Score: One  Imaging: Dg Lumbar Spine Complete  09/14/2015  CLINICAL DATA:  Low back pain for 2 years. Occasional left sciatica. EXAM: LUMBAR SPINE - COMPLETE 4+ VIEW COMPARISON:  Report of radiographs dated 08/14/2013 and abdominal images dated 08/31/2013 and radiographs dated 11/23/2005 and CT scan of the abdomen and pelvis dated 04/16/2013 the FINDINGS: There is abnormal lucency in the region of the right pedicle of L5. This appears to represent a change since the prior exams. The possibility of a focal destructive lesion should be considered. Slight facet arthritis at L4-5. No disc space narrowing or subluxation. Extensive calcification in the abdominal aorta and iliac arteries. IMPRESSION: Possible destructive lesion in the right pedicle of L5. This is not definitive but that area appears different than on multiple prior exams. I recommend MRI of the lumbar spine with and  without contrast for further evaluation. Electronically Signed   By: Lorriane Shire M.D.   On: 09/14/2015 14:05   Ct Chest W Contrast  09/23/2015  CLINICAL DATA:  Bladder cancer EXAM: CT CHEST, ABDOMEN, AND PELVIS WITH CONTRAST TECHNIQUE: Multidetector CT imaging of the chest, abdomen and pelvis was performed following the standard protocol during bolus administration of intravenous contrast. CONTRAST:  181mL ISOVUE-300 IOPAMIDOL (ISOVUE-300) INJECTION 61% COMPARISON:  04/16/2013 FINDINGS: CT CHEST No abnormal mediastinal adenopathy. Atherosclerotic calcifications are noted. Mild LAD territory coronary artery calcification. Mild aortic valve calcification. No pneumothorax.  No pleural effusion Dependent subsegmental atelectasis.  No evidence of lung nodule. There is a destructive lesion in the posterior aspect of T5 which breaks through the posterior cortex. Epidural tumor on the right side is present. There is also a lytic  bone lesion within the posterior T7 vertebral body. Common T8 posterior vertebral body, T10 vertebral body, and T11 vertebral body. CT ABDOMEN AND PELVIS The right lobe of the liver is diffusely infiltrated with heterogeneous mass effect. This occupies an area 19.5 x 13.2 cm on image 57. The left lobe of the liver is relatively spared. There is a 2.4 cm lesion in the medial segment of the left lobe on image 62. There is also a smaller ill-defined lesion in the lateral segment of the left lobe on image 55. Nearly the entire right lobe is involved. Spleen is mildly enlarged without obvious focal mass Kidneys pancreas, and adrenal glands are within normal limits. Cholelithiasis. Abnormal retroperitoneal adenopathy. 15 mm short axis diameter left para-aortic lymph node on image 63. Other adjacent smaller lymph nodes are noted. Abnormal left external iliac adenopathy. 1.9 cm left external iliac node on image 114. Adjacent 13 mm lymph node. 15 mm left obturator node on image 121. Atherosclerotic calcifications involve the aorta, iliac arteries, and visceral vasculature. No free-fluid. Ventral hernia to the right of midline in the anterior abdomen contains adipose tissue. Widespread metastatic disease involves the lumbar vertebral bodies as noted on a recent MRI. Large lesion and invades the right side of L3 and right psoas muscle. There is a lesion in the right side of the L5 vertebral body extending into the pedicle. This does cause cortical breakthrough. There is a destructive lesion in the left femoral neck the anterior cortex of the femoral neck is intact. The superior cortex has been nearly completely destroyed. There is a destructive lesion in the left inferior pubic ramus. There is also a lesion in the right pubic body and left pubic body IMPRESSION: Other than bony disease, there is no evidence of metastatic disease in the thorax. Metastatic disease in the thoracic spine is seen at multiple levels. At T5, there is  a posterior vertebral body lesion with cortical breakthrough and epidural tumor Extensive metastatic disease throughout the liver. Nearly the entire right lobe is occupied by tumor Abnormal left para-aortic and left iliac adenopathy Widespread metastatic disease in the lumbar spine, pelvis, and left femoral neck as described. Cholelithiasis. Electronically Signed   By: Marybelle Killings M.D.   On: 09/23/2015 15:50   Mr Jeri Cos [id number] Contrast  09/26/2015  CLINICAL DATA:  55 year old male with bladder cancer and mental status changes. Subsequent encounter. EXAM: MRI HEAD WITHOUT AND WITH CONTRAST TECHNIQUE: Multiplanar, multiecho pulse sequences of the brain and surrounding structures were obtained without and with intravenous contrast. CONTRAST:  20 cc MultiHance. COMPARISON:  No comparison brain MR. Thoracic spine MR 09/26/2015 dictated separately. Comparison lumbar spine MR 09/14/2015. Comparison cervical  spine MR 07/31/2018 2009. FINDINGS: No acute infarct or intracranial hemorrhage. Left frontal calvarium 1.3 cm expansile lesion. Right parietal calvarial 6 mm enhancing lesion. Right occipital condyle 1 cm enhancing lesion. These findings are consistent with osseous metastatic disease with adjacent dural enhancement. Clivus and upper cervical vertebra of diffuse altered signal intensity. This is noted on remote cervical spine MR and it is possible is normal for patient or related to anemia rather than representing osseous metastatic disease. Attention to this on follow. No brain parenchymal enhancing lesion. Subtle FLAIR altered signal intensity near the aqueduct. Question if this may reflect changes of paraneoplastic syndrome versus result of treatment of tumor. Mild global atrophy without hydrocephalus. Major intracranial vascular structures are patent. Minimal partial opacification left mastoid air cells. Minimal polypoid opacification posterior left maxillary sinus. No orbital abnormality noted. Cervical medullary  junction within normal limits. IMPRESSION: Left frontal calvarium 1.3 cm expansile lesion. Right parietal calvarial 6 mm enhancing lesion. Right occipital condyle 1 cm enhancing lesion. These findings are consistent with osseous metastatic disease with adjacent dural enhancement. Clivus and upper cervical vertebra of diffuse altered signal intensity. This is noted on remote cervical spine MR and it is possible is normal for patient or related to anemia rather than representing osseous metastatic disease. Attention to this on follow-up. No brain parenchymal enhancing lesion. Subtle FLAIR altered signal intensity near the aqueduct. Question if this may reflect changes of paraneoplastic syndrome versus result of treatment of tumor. Mild global atrophy without hydrocephalus. No acute infarct or intracranial hemorrhage. Minimal partial opacification left mastoid air cells. Minimal polypoid opacification posterior left maxillary sinus. Electronically Signed   By: Genia Del M.D.   On: 09/26/2015 16:54   Mr Thoracic Spine W Wo Contrast  09/26/2015  CLINICAL DATA:  Bladder cancer. Lumbar metastases. Mental status changes. Abnormal chest CT. EXAM: MRI THORACIC SPINE WITHOUT AND WITH CONTRAST TECHNIQUE: Multiplanar and multiecho pulse sequences of the thoracic spine were obtained without and with intravenous contrast. CONTRAST:  57mL MULTIHANCE GADOBENATE DIMEGLUMINE 529 MG/ML IV SOLN COMPARISON:  CT the chest 09/23/2015. FINDINGS: Normal signal is present in the thoracic spinal cord. Conus medullaris terminates at L1, within normal limits. Volume loss and atelectasis is present at the right lung base. Diffuse metastases are again noted throughout the right lobe of the liver. There is diffuse heterogeneous enhancement within the T5 vertebral body. Extraosseous extension of tumor extends into the spinal canal effaces the ventral CSF, right greater than left. There is mild narrowing of the right T5-6 foramen due to tumor.  The posterior canal and left foramen are patent. A hemangioma is present at T1. Punctate foci of enhancement are present centrally at T3 and on the right at T4. There is more diffuse enhancement on the left at T4 extending to the pedicle. Peripheral enhancement is associated with hemangiomas at T7 and T9. A punctate lesion is present just left of midline at T7. A 17 mm lesion is present superiorly and posteriorly at T8. A 12 mm lesion is present at the base the right pedicle at T9. Enhancing lesion posteriorly in centrally at T10 is measures 13 x 17 mm. The lesion posteriorly and inferiorly at T12 is better seen on the lumbar spine MRI. Enhancement is limited given that it is at the edge of the field of view. No significant focal disc protrusion is present. No other significant foraminal stenosis is evident. IMPRESSION: 1. Multi-level metastatic disease of the thoracic spine. 2. The most prominent level is T5 for  extraosseous tumor extends into the ventral CSF, right greater than left. There is partial effacement of the ventral CSF at this level without abnormal cord signal. 3. Mild right foraminal narrowing at T5-6 due to tumor encroachment. 4. Additional metastatic disease is present at T3, T4, T7, T8, T9,, T10, and T12. 5. Extensive metastatic disease to the liver. 6. Atelectasis or scarring at the right lung base. Electronically Signed   By: San Morelle M.D.   On: 09/26/2015 16:27   Mr Lumbar Spine W Wo Contrast  09/14/2015  CLINICAL DATA:  Personal history of bladder cancer. Low back pain, 2 years duration with left leg radiating pain. EXAM: MRI LUMBAR SPINE WITHOUT AND WITH CONTRAST TECHNIQUE: Multiplanar and multiecho pulse sequences of the lumbar spine were obtained without and with intravenous contrast. CONTRAST:  14mL MULTIHANCE GADOBENATE DIMEGLUMINE 529 MG/ML IV SOLN COMPARISON:  Radiography same day.  CT 04/16/2013. FINDINGS: There is widespread and extensive osseous metastatic disease. The  examination covers the region from T9 through S3. There are foci of metastatic disease affecting every vertebral level. This is most advanced within the T10 vertebral body, the L3 vertebral body and the right side of the L5 vertebral body and posterior elements on the right as suggested at plain radiography. No extraosseous tumor, primary neural metastasis or nerve compression is demonstrated at L2 or above. At L3, tumor extends outward from the right side of vertebral body into the psoas muscle. There is mild encroachment in the right extra foraminal region that could affect the right L3 nerve root. This tumor mass measures approximately 4 cm in diameter. At the L5 level, tumor on the right within the pedicle measures approximately 3 cm in diameter, and does encroach upon the intervertebral foramina on the right at both L4-5 and L5-S1 and could affect those nerves. There are numerous small retroperitoneal lymph nodes, most on the order of 1 cm in size consistent with metastatic nodal disease. IMPRESSION: Widespread osseous metastatic disease. No spinal canal encroachment. Tumor extends from the right side of the vertebral body at L3 into the right psoas muscle, measuring about 4 cm in diameter. Tumor in the right pedicle of L5 encroaches upon the intervertebral foramina on the right at L4-5 and L5-S1 and could affect those exiting nerves. Retroperitoneal lymphadenopathy. Electronically Signed   By: Nelson Chimes M.D.   On: 09/14/2015 20:04   Ct Abdomen Pelvis W Contrast  09/23/2015  CLINICAL DATA:  Bladder cancer EXAM: CT CHEST, ABDOMEN, AND PELVIS WITH CONTRAST TECHNIQUE: Multidetector CT imaging of the chest, abdomen and pelvis was performed following the standard protocol during bolus administration of intravenous contrast. CONTRAST:  140mL ISOVUE-300 IOPAMIDOL (ISOVUE-300) INJECTION 61% COMPARISON:  04/16/2013 FINDINGS: CT CHEST No abnormal mediastinal adenopathy. Atherosclerotic calcifications are noted.  Mild LAD territory coronary artery calcification. Mild aortic valve calcification. No pneumothorax.  No pleural effusion Dependent subsegmental atelectasis.  No evidence of lung nodule. There is a destructive lesion in the posterior aspect of T5 which breaks through the posterior cortex. Epidural tumor on the right side is present. There is also a lytic bone lesion within the posterior T7 vertebral body. Common T8 posterior vertebral body, T10 vertebral body, and T11 vertebral body. CT ABDOMEN AND PELVIS The right lobe of the liver is diffusely infiltrated with heterogeneous mass effect. This occupies an area 19.5 x 13.2 cm on image 57. The left lobe of the liver is relatively spared. There is a 2.4 cm lesion in the medial segment of the left lobe  on image 62. There is also a smaller ill-defined lesion in the lateral segment of the left lobe on image 55. Nearly the entire right lobe is involved. Spleen is mildly enlarged without obvious focal mass Kidneys pancreas, and adrenal glands are within normal limits. Cholelithiasis. Abnormal retroperitoneal adenopathy. 15 mm short axis diameter left para-aortic lymph node on image 63. Other adjacent smaller lymph nodes are noted. Abnormal left external iliac adenopathy. 1.9 cm left external iliac node on image 114. Adjacent 13 mm lymph node. 15 mm left obturator node on image 121. Atherosclerotic calcifications involve the aorta, iliac arteries, and visceral vasculature. No free-fluid. Ventral hernia to the right of midline in the anterior abdomen contains adipose tissue. Widespread metastatic disease involves the lumbar vertebral bodies as noted on a recent MRI. Large lesion and invades the right side of L3 and right psoas muscle. There is a lesion in the right side of the L5 vertebral body extending into the pedicle. This does cause cortical breakthrough. There is a destructive lesion in the left femoral neck the anterior cortex of the femoral neck is intact. The superior  cortex has been nearly completely destroyed. There is a destructive lesion in the left inferior pubic ramus. There is also a lesion in the right pubic body and left pubic body IMPRESSION: Other than bony disease, there is no evidence of metastatic disease in the thorax. Metastatic disease in the thoracic spine is seen at multiple levels. At T5, there is a posterior vertebral body lesion with cortical breakthrough and epidural tumor Extensive metastatic disease throughout the liver. Nearly the entire right lobe is occupied by tumor Abnormal left para-aortic and left iliac adenopathy Widespread metastatic disease in the lumbar spine, pelvis, and left femoral neck as described. Cholelithiasis. Electronically Signed   By: Marybelle Killings M.D.   On: 09/23/2015 15:50    Labs:  CBC:  Recent Labs  09/30/15 0945  WBC 10.1  HGB 12.3*  HCT 38.7*  PLT 212    COAGS:  Recent Labs  09/30/15 0945  INR 1.20  APTT 31    BMP: No results for input(s): NA, K, CL, CO2, GLUCOSE, BUN, CALCIUM, CREATININE, GFRNONAA, GFRAA in the last 8760 hours.  Invalid input(s): CMP  LIVER FUNCTION TESTS: No results for input(s): BILITOT, AST, ALT, ALKPHOS, PROT, ALBUMIN in the last 8760 hours.  TUMOR MARKERS: No results for input(s): AFPTM, CEA, CA199, CHROMGRNA in the last 8760 hours.  Assessment and Plan:  Hx urinary bladder cancer Now with widespread disease Brain mets Scheduled for Lumbar 3 paraspinal mass bx Risks and Benefits discussed with the patient including, but not limited to bleeding, infection, damage to adjacent structures or low yield requiring additional tests. All of the patient's questions were answered, patient is agreeable to proceed. Consent signed and in chart.   Thank you for this interesting consult.  I greatly enjoyed meeting [name] and look forward to participating in their care.  A copy of this report was sent to the requesting provider on this date.  Electronically  Signed: Dmetrius Ambs A 09/30/2015, 10:17 AM   I spent a total of  30 Minutes   in face to face in clinical consultation, greater than 50% of which was counseling/coordinating care for L3 paraspinal mass bx

## 2015-09-30 NOTE — Sedation Documentation (Signed)
Patient denies pain and is resting comfortably.  

## 2015-09-30 NOTE — Discharge Instructions (Signed)

## 2015-09-30 NOTE — Procedures (Signed)
Successful RT L3 PARASPINOUS MASS CT BX NO COMP STABLE PATH PENDING FULL REPORT IN PACS

## 2015-09-30 NOTE — Sedation Documentation (Signed)
Patient reports pain with movement only at this time

## 2015-09-30 NOTE — Sedation Documentation (Signed)
Patient is resting comfortably. 

## 2015-10-02 ENCOUNTER — Inpatient Hospital Stay (HOSPITAL_COMMUNITY)
Admission: EM | Admit: 2015-10-02 | Discharge: 2015-10-04 | DRG: 641 | Disposition: A | Discharge status: Hospice | Payer: Federal, State, Local not specified - PPO | Attending: Internal Medicine | Admitting: Internal Medicine

## 2015-10-02 ENCOUNTER — Encounter (HOSPITAL_COMMUNITY): Payer: Self-pay | Admitting: *Deleted

## 2015-10-02 ENCOUNTER — Emergency Department (HOSPITAL_COMMUNITY): Payer: Federal, State, Local not specified - PPO

## 2015-10-02 DIAGNOSIS — E860 Dehydration: Secondary | ICD-10-CM | POA: Diagnosis present

## 2015-10-02 DIAGNOSIS — I10 Essential (primary) hypertension: Secondary | ICD-10-CM | POA: Diagnosis present

## 2015-10-02 DIAGNOSIS — K5730 Diverticulosis of large intestine without perforation or abscess without bleeding: Secondary | ICD-10-CM | POA: Insufficient documentation

## 2015-10-02 DIAGNOSIS — L89150 Pressure ulcer of sacral region, unstageable: Secondary | ICD-10-CM | POA: Diagnosis present

## 2015-10-02 DIAGNOSIS — Z923 Personal history of irradiation: Secondary | ICD-10-CM

## 2015-10-02 DIAGNOSIS — R531 Weakness: Secondary | ICD-10-CM | POA: Diagnosis not present

## 2015-10-02 DIAGNOSIS — L8991 Pressure ulcer of unspecified site, stage 1: Secondary | ICD-10-CM

## 2015-10-02 DIAGNOSIS — R627 Adult failure to thrive: Secondary | ICD-10-CM | POA: Diagnosis present

## 2015-10-02 DIAGNOSIS — M1990 Unspecified osteoarthritis, unspecified site: Secondary | ICD-10-CM | POA: Insufficient documentation

## 2015-10-02 DIAGNOSIS — Z809 Family history of malignant neoplasm, unspecified: Secondary | ICD-10-CM | POA: Diagnosis not present

## 2015-10-02 DIAGNOSIS — E785 Hyperlipidemia, unspecified: Secondary | ICD-10-CM | POA: Insufficient documentation

## 2015-10-02 DIAGNOSIS — Z87891 Personal history of nicotine dependence: Secondary | ICD-10-CM

## 2015-10-02 DIAGNOSIS — C7951 Secondary malignant neoplasm of bone: Secondary | ICD-10-CM | POA: Diagnosis present

## 2015-10-02 DIAGNOSIS — E872 Acidosis, unspecified: Secondary | ICD-10-CM | POA: Diagnosis present

## 2015-10-02 DIAGNOSIS — E871 Hypo-osmolality and hyponatremia: Secondary | ICD-10-CM | POA: Diagnosis present

## 2015-10-02 DIAGNOSIS — C801 Malignant (primary) neoplasm, unspecified: Secondary | ICD-10-CM | POA: Diagnosis present

## 2015-10-02 DIAGNOSIS — C787 Secondary malignant neoplasm of liver and intrahepatic bile duct: Secondary | ICD-10-CM | POA: Diagnosis present

## 2015-10-02 DIAGNOSIS — Z79891 Long term (current) use of opiate analgesic: Secondary | ICD-10-CM | POA: Diagnosis not present

## 2015-10-02 DIAGNOSIS — N390 Urinary tract infection, site not specified: Secondary | ICD-10-CM | POA: Diagnosis present

## 2015-10-02 DIAGNOSIS — R8271 Bacteriuria: Secondary | ICD-10-CM

## 2015-10-02 DIAGNOSIS — E861 Hypovolemia: Secondary | ICD-10-CM | POA: Diagnosis present

## 2015-10-02 DIAGNOSIS — R7989 Other specified abnormal findings of blood chemistry: Secondary | ICD-10-CM | POA: Diagnosis present

## 2015-10-02 DIAGNOSIS — R945 Abnormal results of liver function studies: Secondary | ICD-10-CM

## 2015-10-02 DIAGNOSIS — C679 Malignant neoplasm of bladder, unspecified: Secondary | ICD-10-CM | POA: Diagnosis present

## 2015-10-02 DIAGNOSIS — F419 Anxiety disorder, unspecified: Secondary | ICD-10-CM | POA: Insufficient documentation

## 2015-10-02 DIAGNOSIS — K439 Ventral hernia without obstruction or gangrene: Secondary | ICD-10-CM | POA: Insufficient documentation

## 2015-10-02 DIAGNOSIS — L89159 Pressure ulcer of sacral region, unspecified stage: Secondary | ICD-10-CM | POA: Diagnosis present

## 2015-10-02 HISTORY — DX: Secondary malignant neoplasm of bone: C79.51

## 2015-10-02 HISTORY — DX: Malignant neoplasm of urinary organ, unspecified: C68.9

## 2015-10-02 HISTORY — DX: Secondary malignant neoplasm of liver and intrahepatic bile duct: C78.7

## 2015-10-02 LAB — BASIC METABOLIC PANEL
ANION GAP: 13 (ref 5–15)
BUN: 24 mg/dL — ABNORMAL HIGH (ref 6–20)
CO2: 21 mmol/L — ABNORMAL LOW (ref 22–32)
Calcium: 9.1 mg/dL (ref 8.9–10.3)
Chloride: 96 mmol/L — ABNORMAL LOW (ref 101–111)
Creatinine, Ser: 0.8 mg/dL (ref 0.61–1.24)
GFR calc Af Amer: >60 mL/min (ref >60)
GLUCOSE: 102 mg/dL — AB (ref 65–99)
POTASSIUM: 4.5 mmol/L (ref 3.5–5.1)
SODIUM: 130 mmol/L — AB (ref 135–145)

## 2015-10-02 LAB — COMPREHENSIVE METABOLIC PANEL
ALT: 67 U/L — ABNORMAL HIGH (ref 17–63)
AST: 267 U/L — ABNORMAL HIGH (ref 15–41)
Albumin: 2.8 g/dL — ABNORMAL LOW (ref 3.5–5.0)
Alkaline Phosphatase: 165 U/L — ABNORMAL HIGH (ref 38–126)
Anion gap: 13 (ref 5–15)
BILIRUBIN TOTAL: 2.4 mg/dL — AB (ref 0.3–1.2)
BUN: 25 mg/dL — AB (ref 6–20)
CHLORIDE: 92 mmol/L — AB (ref 101–111)
CO2: 23 mmol/L (ref 22–32)
Calcium: 9.5 mg/dL (ref 8.9–10.3)
Creatinine, Ser: 0.82 mg/dL (ref 0.61–1.24)
Glucose, Bld: 100 mg/dL — ABNORMAL HIGH (ref 65–99)
POTASSIUM: 4.7 mmol/L (ref 3.5–5.1)
Sodium: 128 mmol/L — ABNORMAL LOW (ref 135–145)
TOTAL PROTEIN: 7.1 g/dL (ref 6.5–8.1)

## 2015-10-02 LAB — URINE MICROSCOPIC-ADD ON

## 2015-10-02 LAB — CBC WITH DIFFERENTIAL/PLATELET
BASOS PCT: 0 %
Basophils Absolute: 0 10*3/uL (ref 0.0–0.1)
EOS PCT: 1 %
Eosinophils Absolute: 0.1 10*3/uL (ref 0.0–0.7)
HEMATOCRIT: 36.3 % — AB (ref 39.0–52.0)
HEMOGLOBIN: 11.7 g/dL — AB (ref 13.0–17.0)
Lymphocytes Relative: 5 %
Lymphs Abs: 0.3 10*3/uL — ABNORMAL LOW (ref 0.7–4.0)
MCH: 27.4 pg (ref 26.0–34.0)
MCHC: 32.2 g/dL (ref 30.0–36.0)
MCV: 85 fL (ref 78.0–100.0)
MONOS PCT: 10 %
Monocytes Absolute: 0.6 10*3/uL (ref 0.1–1.0)
NEUTROS PCT: 84 %
Neutro Abs: 4.9 10*3/uL (ref 1.7–7.7)
Platelets: 165 10*3/uL (ref 150–400)
RBC: 4.27 MIL/uL (ref 4.22–5.81)
RDW: 14.8 % (ref 11.5–15.5)
WBC MORPHOLOGY: INCREASED
WBC: 5.9 10*3/uL (ref 4.0–10.5)

## 2015-10-02 LAB — URINALYSIS, ROUTINE W REFLEX MICROSCOPIC
Glucose, UA: NEGATIVE mg/dL
Hgb urine dipstick: NEGATIVE
KETONES UR: NEGATIVE mg/dL
LEUKOCYTES UA: NEGATIVE
NITRITE: POSITIVE — AB
PH: 5 (ref 5.0–8.0)
Protein, ur: NEGATIVE mg/dL
Specific Gravity, Urine: 1.02 (ref 1.005–1.030)

## 2015-10-02 LAB — PROTIME-INR
INR: 1.23 (ref 0.00–1.49)
Prothrombin Time: 15.7 seconds — ABNORMAL HIGH (ref 11.6–15.2)

## 2015-10-02 LAB — I-STAT CG4 LACTIC ACID, ED: LACTIC ACID, VENOUS: 3.77 mmol/L — AB (ref 0.5–2.0)

## 2015-10-02 LAB — LACTIC ACID, PLASMA
LACTIC ACID, VENOUS: 2.9 mmol/L — AB (ref 0.5–2.0)
LACTIC ACID, VENOUS: 2.8 mmol/L — AB (ref 0.5–2.0)

## 2015-10-02 LAB — AMMONIA: Ammonia: 24 umol/L (ref 9–35)

## 2015-10-02 MED ORDER — BARRIER CREAM NON-SPECIFIED
1.0000 "application " | TOPICAL_CREAM | Freq: Two times a day (BID) | TOPICAL | Status: DC | PRN
  Filled 2015-10-02: qty 1

## 2015-10-02 MED ORDER — ALPRAZOLAM 1 MG PO TABS
1.0000 mg | ORAL_TABLET | Freq: Every day | ORAL | Status: DC
  Administered 2015-10-03 – 2015-10-04 (×2): 1 mg via ORAL
  Filled 2015-10-02 (×2): qty 1

## 2015-10-02 MED ORDER — MORPHINE SULFATE ER 15 MG PO TBCR
45.0000 mg | EXTENDED_RELEASE_TABLET | Freq: Two times a day (BID) | ORAL | Status: DC
  Administered 2015-10-02 – 2015-10-04 (×4): 45 mg via ORAL
  Filled 2015-10-02 (×4): qty 3

## 2015-10-02 MED ORDER — POLYETHYLENE GLYCOL 3350 17 G PO PACK: 17.0000 g | PACK | Freq: Every day | ORAL | Status: DC | PRN

## 2015-10-02 MED ORDER — OXYCODONE-ACETAMINOPHEN 5-325 MG PO TABS
1.0000 | ORAL_TABLET | ORAL | Status: DC | PRN
Start: 2015-10-02 — End: 2015-10-04
  Administered 2015-10-02 – 2015-10-04 (×4): 1 via ORAL
  Filled 2015-10-02 (×4): qty 1

## 2015-10-02 MED ORDER — ACETAMINOPHEN 325 MG PO TABS: 650.0000 mg | ORAL_TABLET | Freq: Four times a day (QID) | ORAL | Status: DC | PRN

## 2015-10-02 MED ORDER — ONDANSETRON HCL 4 MG PO TABS: 4.0000 mg | ORAL_TABLET | Freq: Four times a day (QID) | ORAL | Status: DC | PRN

## 2015-10-02 MED ORDER — SODIUM CHLORIDE 0.9 % IV BOLUS (SEPSIS)
1000.0000 mL | Freq: Once | INTRAVENOUS | Status: AC
  Administered 2015-10-02: 1000 mL via INTRAVENOUS

## 2015-10-02 MED ORDER — ONDANSETRON HCL 4 MG/2ML IJ SOLN: 4.0000 mg | Freq: Four times a day (QID) | INTRAMUSCULAR | Status: DC | PRN

## 2015-10-02 MED ORDER — ACETAMINOPHEN 650 MG RE SUPP: 650.0000 mg | Freq: Four times a day (QID) | RECTAL | Status: DC | PRN

## 2015-10-02 MED ORDER — HYDROMORPHONE HCL 1 MG/ML IJ SOLN
1.0000 mg | Freq: Once | INTRAMUSCULAR | Status: AC
  Administered 2015-10-02: 1 mg via INTRAVENOUS
  Filled 2015-10-02: qty 1

## 2015-10-02 MED ORDER — ENOXAPARIN SODIUM 40 MG/0.4ML ~~LOC~~ SOLN
40.0000 mg | SUBCUTANEOUS | Status: DC
  Administered 2015-10-02 – 2015-10-04 (×3): 40 mg via SUBCUTANEOUS
  Filled 2015-10-02 (×3): qty 0.4

## 2015-10-02 MED ORDER — ATENOLOL 25 MG PO TABS
50.0000 mg | ORAL_TABLET | Freq: Every morning | ORAL | Status: DC
Start: 2015-10-03 — End: 2015-10-04
  Administered 2015-10-03 – 2015-10-04 (×2): 50 mg via ORAL
  Filled 2015-10-02 (×2): qty 2

## 2015-10-02 MED ORDER — MORPHINE SULFATE ER 30 MG PO TBCR: 30.0000 mg | EXTENDED_RELEASE_TABLET | Freq: Two times a day (BID) | ORAL | Status: DC

## 2015-10-02 MED ORDER — OXYCODONE HCL 5 MG PO TABS
5.0000 mg | ORAL_TABLET | ORAL | Status: DC | PRN
  Administered 2015-10-03 – 2015-10-04 (×3): 5 mg via ORAL
  Filled 2015-10-02 (×4): qty 1

## 2015-10-02 MED ORDER — DOCUSATE SODIUM 100 MG PO CAPS
100.0000 mg | ORAL_CAPSULE | Freq: Two times a day (BID) | ORAL | Status: DC
  Administered 2015-10-02 – 2015-10-03 (×2): 100 mg via ORAL
  Filled 2015-10-02 (×2): qty 1

## 2015-10-02 MED ORDER — OXYCODONE-ACETAMINOPHEN 10-325 MG PO TABS: 1.0000 | ORAL_TABLET | ORAL | Status: DC | PRN

## 2015-10-02 MED ORDER — HYDROMORPHONE HCL 1 MG/ML IJ SOLN
1.0000 mg | Freq: Once | INTRAMUSCULAR | Status: AC
Start: 2015-10-02 — End: 2015-10-02
  Administered 2015-10-02: 1 mg via INTRAVENOUS
  Filled 2015-10-02: qty 1

## 2015-10-02 MED ORDER — SODIUM CHLORIDE 0.9 % IV SOLN
INTRAVENOUS | Status: DC
  Administered 2015-10-02 – 2015-10-03 (×4): via INTRAVENOUS

## 2015-10-02 NOTE — ED Provider Notes (Signed)
CSN: [id number]     Arrival date & time 10/02/15  1254 History   First MD Initiated Contact with Patient 10/02/15 1300     Chief Complaint  Patient presents with  . Weakness      Patient is a 55 y.o. male presenting with weakness. The history is provided by the patient.  Weakness This is a new problem. The current episode started more than 1 week ago. Associated symptoms include abdominal pain and shortness of breath. Pertinent negatives include no chest pain.   patient presents with generalized weakness and decreased appetite. He has had back pain and over the last 3 weeks has been diagnosed with bony metastatic disease. Unknown primary. He is currently on radiation for lumbar lesions and left hip lesions. He has had 9 radiation treatments. He has had a decreased appetite and has eaten very little in the last week or 2. He has no appetite. He reportedly has been drinking water but hasn't been doing much else. He does have some pain in his abdomen. There is known metastatic disease to his liver also. 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 Previous history of bladder tumor. He recently had a biopsy of the lumbar lesion but has not had the pathology come back yet.  Past Medical History  Diagnosis Date  . Lesion of bladder   . Hematuria   . Tachycardia     PT UNSURE TYPE--  CONTROLLED W/ ATENOLOL (ALSO STATES HAD NORMAL STRESS TEST )  . Hyperlipidemia   . History of small bowel obstruction     2007 W/ SURGICAL INTERVENTION  . Sigmoid diverticulum   . Asymptomatic cholelithiasis   . Hernia, ventral     INCISIONAL  . Anxiety   . Arthritis   . Bladder cancer ([place]) dx'd 2015  . Bone cancer ([place]) dx'd 08/2015    metastatic dz   Past Surgical History  Procedure Laterality Date  . Colonoscopy N/A 05/12/2013    Procedure: COLONOSCOPY;  Surgeon: Jamesetta So, MD;  [facility]: [facility];  Service: Gastroenterology;  Laterality: N/A;  . Exploratory  laparotomy/  lysis adhesions/  meckel's diverticulectomy  11-02-2005    SBO  . Urinary tract surgery with suprapubic approach  AGE 31  . Abdominal hernia repair  2002  . Appendectomy  AGE 20  . Cysto/  urethral dilation  AGE 20  . Cystoscopy/retrograde/ureteroscopy N/A 08/31/2013    Procedure: CYSTOSCOPY/BLADDER BIOPSY /RETROGRADE/ TURBT;  Surgeon: Franchot Gallo, MD;  Location: [facility];  Service: Urology;  Laterality: N/A;  . Cystoscopy with biopsy N/A 12/29/2013    Procedure: CYSTOSCOPY WITH BLADDER NECK BIOPSIES;  Surgeon: Jorja Loa, MD;  Location: [facility];  Service: Urology;  Laterality: N/A;   Family History  Problem Relation Age of Onset  . Cancer Mother     esophageal   Social History  Substance Use Topics  . Smoking status: Former Smoker -- 1.50 packs/day for 30 years    Types: Cigarettes  . Smokeless tobacco: Never Used  . Alcohol Use: No    Review of Systems  Constitutional: Positive for activity change, appetite change and fatigue. Negative for fever.  Eyes: Negative for visual disturbance.  Respiratory: Positive for shortness of breath.   Cardiovascular: Negative for chest pain.  Gastrointestinal: Positive for abdominal pain.  Musculoskeletal: Positive for back pain and neck pain.  Skin: Negative for rash and wound.  Neurological: Positive for weakness and light-headedness. Negative for speech difficulty.  Hematological: Negative for adenopathy.  Psychiatric/Behavioral: Negative for behavioral problems.      Allergies  Enbrel  Home Medications   Prior to Admission medications   Medication Sig Start Date End Date Taking? Authorizing Provider  ALPRAZolam Duanne Moron) 1 MG tablet Take 1 mg by mouth daily.   Yes Historical Provider, MD  atenolol (TENORMIN) 50 MG tablet Take 50 mg by mouth every morning.    Yes Historical Provider, MD  docusate sodium (COLACE) 100 MG capsule Take 100 mg by mouth 2 (two) times daily.   Yes Historical  Provider, MD  morphine (MS CONTIN) 15 MG 12 hr tablet Take 1 tablet (15 mg total) by mouth every 12 (twelve) hours. 09/27/15  Yes Hayden Pedro, PA-C  morphine (MS CONTIN) 30 MG 12 hr tablet Take 1 tablet (30 mg total) by mouth every 12 (twelve) hours. 09/19/15  Yes Hayden Pedro, PA-C  Multiple Vitamins-Minerals (ONE-A-DAY MENS 50+ ADVANTAGE) TABS Take 1 tablet by mouth daily.   Yes Historical Provider, MD  oxyCODONE-acetaminophen (PERCOCET) 10-325 MG tablet take 1/2 to 1 tablet UP TO four times a day for SEVERE PAIN 09/14/15  Yes Historical Provider, MD  promethazine (PHENERGAN) 25 MG tablet Take 1 tablet (25 mg total) by mouth every 6 (six) hours as needed for nausea or vomiting. 09/23/15  Yes Eppie Gibson, MD   BP 119/63 mmHg  Pulse 75  Temp(Src) 98.1 F (36.7 C) (Oral)  Resp 17  Ht 6\' 1"  (1.854 m)  Wt 220 lb (99.791 kg)  BMI 29.03 kg/m2  SpO2 94% Physical Exam  Constitutional: He is oriented to person, place, and time. He appears well-developed.  HENT:  Tongue is very dry and somewhat cracked.  Eyes: Pupils are equal, round, and reactive to light.  Neck: Neck supple.  Cardiovascular: Normal rate.   Pulmonary/Chest: Effort normal.  Abdominal: There is tenderness.  Tenderness to right upper quadrant. Some fullness.  Musculoskeletal: Normal range of motion.  Neurological: He is alert and oriented to person, place, and time.  Skin: Skin is warm.    ED Course  Procedures (including critical care time) Labs Review Labs Reviewed  COMPREHENSIVE METABOLIC PANEL - Abnormal; Notable for the following:    Sodium 128 (*)    Chloride 92 (*)    Glucose, Bld 100 (*)    BUN 25 (*)    Albumin 2.8 (*)    AST 267 (*)    ALT 67 (*)    Alkaline Phosphatase 165 (*)    Total Bilirubin 2.4 (*)    All other components within normal limits  CBC WITH DIFFERENTIAL/PLATELET - Abnormal; Notable for the following:    Hemoglobin 11.7 (*)    HCT 36.3 (*)    Lymphs Abs 0.3 (*)    All  other components within normal limits  URINALYSIS, ROUTINE W REFLEX MICROSCOPIC (NOT AT [facility]) - Abnormal; Notable for the following:    Color, Urine ORANGE (*)    APPearance HAZY (*)    Bilirubin Urine SMALL (*)    Nitrite POSITIVE (*)    All other components within normal limits  PROTIME-INR - Abnormal; Notable for the following:    Prothrombin Time 15.7 (*)    All other components within normal limits  URINE MICROSCOPIC-ADD ON - Abnormal; Notable for the following:    Squamous Epithelial / LPF 0-5 (*)    Bacteria, UA MANY (*)    Casts GRANULAR CAST (*)    All other components within normal limits  I-STAT  CG4 LACTIC ACID, ED - Abnormal; Notable for the following:    Lactic Acid, Venous 3.77 (*)    All other components within normal limits  URINE CULTURE  AMMONIA    Imaging Review Dg Chest 2 View  10/02/2015  CLINICAL DATA:  Decreased appetite for 1 week, increased weakness beginning today, history bladder cancer, back pain, unable to bear weight LEFT leg, lumbar metastatic disease by MR, former smoker EXAM: CHEST  2 VIEW COMPARISON:  CT chest 09/23/2015 FINDINGS: Upper normal size of cardiac silhouette. Mediastinal contours and pulmonary vascularity normal. Central peribronchial thickening. Elevation of RIGHT diaphragm with RIGHT basilar atelectasis. Lungs otherwise clear. No pleural effusion or pneumothorax. No definite acute osseous findings. IMPRESSION: Minimal bronchitic changes with RIGHT basilar atelectasis. Electronically Signed   By: Lavonia Dana M.D.   On: 10/02/2015 14:01   I have personally reviewed and evaluated these images and lab results as part of my medical decision-making.   EKG Interpretation   Date/Time:  Sunday October 02 2015 12:56:20 EDT Ventricular Rate:  78 PR Interval:  157 QRS Duration: 93 QT Interval:  360 QTC Calculation: 410 R Axis:   25 Text Interpretation:  Sinus rhythm Confirmed by Alvino Chapel  MD, Ovid Curd  [phone]) on 10/02/2015 1:25:44 PM      MDM    Final diagnoses:  Lactic acidosis  Hyponatremia  Dehydration  Spine metastasis ([place])    Patient presents with generalized weakness and decreased oral intake. Has metastatic cancer of an unknown primary and has been on radiation. Has not been eating much. Has hyponatremia lactic acidosis. Does have bacteria in urine without white cells. Will admit to internal medicine.    Davonna Belling, MD 10/02/15 [phone]

## 2015-10-02 NOTE — ED Notes (Signed)
Pt brought in by [facility] from home with wife at bedside. Wife reports c/o decreased appetite for 1 week and dramatic increase in weakness that started today. Per wife pt has hx of bladder CA in the past and has been treated, approximately 1 month ago pt starting having back pain and was unable to bear weight on left leg, MRI on 3/15 revealed tumors on pt's lumbar spine and left femur, CT 2 weeks ago revealed tumors on thoracic spine, liver, and skull. [facility] reports BP 130/76, HR 80, O2 sat 94% on RA. Pt's wife very anxious at bedside stating, "I just don't know what to do anymore, so I brought him here, Dr. Karie Kirks told me to". Pt's wife reports calling Dr. Karie Kirks this morning and was told to bring pt to ED. Pt A&O x 4.

## 2015-10-02 NOTE — H&P (Signed)
History and Physical  [name] [id number] DOB: 23-Mar-1961 DOA: 10/02/2015  Referring physician: Dr Alvino Chapel, ED physician PCP: Robert Bellow, MD   Chief Complaint: Weakness  HPI: [name] is a 55 y.o. male  This is a 55 year old male with history of hyperlipidemia, incisional ventral hernia, anxiety, history of bladder cancer, bony and liver metastatic disease with unknown primary. Patient is currently undergoing radiation for lumbar lesions and left hip lesions. He started treatments 3 weeks ago. Over the course of the past 2 weeks, the patient's appetite has diminished significantly. He reports no appetite. He has been drinking water, but even this intake has been diminished as well. No palliating or provoking factors. His symptoms have been worsening He has recently had biopsy of his lumbar lesion, but pathology has not come back. He denies fevers, chills, dysuria, abdominal pain, incomplete bladder emptying.   Review of Systems:   Pt denies any fevers, chills, nausea, vomiting, diarrhea, constipation, abdominal pain, shortness of breath, dyspnea on exertion, orthopnea, cough, wheezing, palpitations, headache, vision changes, lightheadedness, dizziness, diarrhea, constipation, melena, rectal bleeding.  Review of systems are otherwise negative  Past Medical History  Diagnosis Date  . Lesion of bladder   . Hematuria   . Tachycardia     PT UNSURE TYPE--  CONTROLLED W/ ATENOLOL (ALSO STATES HAD NORMAL STRESS TEST )  . Hyperlipidemia   . History of small bowel obstruction     2007 W/ SURGICAL INTERVENTION  . Sigmoid diverticulum   . Asymptomatic cholelithiasis   . Hernia, ventral     INCISIONAL  . Anxiety   . Arthritis   . Bladder cancer ([place]) dx'd 2015  . Bone cancer ([place]) dx'd 08/2015    metastatic dz   Past Surgical History  Procedure Laterality Date  . Colonoscopy N/A 05/12/2013    Procedure: COLONOSCOPY;  Surgeon: Jamesetta So, MD;  [facility];  Service: Gastroenterology;  Laterality: N/A;  . Exploratory laparotomy/  lysis adhesions/  meckel's diverticulectomy  11-02-2005    SBO  . Urinary tract surgery with suprapubic approach  AGE 76  . Abdominal hernia repair  2002  . Appendectomy  AGE 12  . Cysto/  urethral dilation  AGE 12  . Cystoscopy/retrograde/ureteroscopy N/A 08/31/2013    Procedure: CYSTOSCOPY/BLADDER BIOPSY /RETROGRADE/ TURBT;  Surgeon: Franchot Gallo, MD;  Location: [facility];  Service: Urology;  Laterality: N/A;  . Cystoscopy with biopsy N/A 12/29/2013    Procedure: CYSTOSCOPY WITH BLADDER NECK BIOPSIES;  Surgeon: Jorja Loa, MD;  Location: [facility];  Service: Urology;  Laterality: N/A;   Social History:  reports that he has quit smoking. His smoking use included Cigarettes. He has a 45 pack-year smoking history. He has never used smokeless tobacco. He reports that he does not drink alcohol or use illicit drugs. Patient lives at home & is able to participate in activities of daily living  Allergies  Allergen Reactions  . Enbrel [Etanercept] Rash    Family History  Problem Relation Age of Onset  . Cancer Mother     esophageal     Prior to Admission medications   Medication Sig Start Date End Date Taking? Authorizing Provider  ALPRAZolam Duanne Moron) 1 MG tablet Take 1 mg by mouth daily.   Yes Historical Provider, MD  atenolol (TENORMIN) 50 MG tablet Take 50 mg by mouth every morning.    Yes Historical Provider, MD  docusate sodium (COLACE) 100 MG capsule Take 100 mg by mouth  2 (two) times daily.   Yes Historical Provider, MD  morphine (MS CONTIN) 15 MG 12 hr tablet Take 1 tablet (15 mg total) by mouth every 12 (twelve) hours. 09/27/15  Yes Hayden Pedro, PA-C  morphine (MS CONTIN) 30 MG 12 hr tablet Take 1 tablet (30 mg total) by mouth every 12 (twelve) hours. 09/19/15  Yes Hayden Pedro, PA-C  Multiple Vitamins-Minerals (ONE-A-DAY MENS 50+ ADVANTAGE) TABS Take 1 tablet by  mouth daily.   Yes Historical Provider, MD  oxyCODONE-acetaminophen (PERCOCET) 10-325 MG tablet take 1/2 to 1 tablet UP TO four times a day for SEVERE PAIN 09/14/15  Yes Historical Provider, MD  promethazine (PHENERGAN) 25 MG tablet Take 1 tablet (25 mg total) by mouth every 6 (six) hours as needed for nausea or vomiting. 09/23/15  Yes Eppie Gibson, MD    Physical Exam: BP 119/63 mmHg  Pulse 75  Temp(Src) 98.1 F (36.7 C) (Oral)  Resp 17  Ht 6\' 1"  (1.854 m)  Wt 99.791 kg (220 lb)  BMI 29.03 kg/m2  SpO2 94%  General: Middle-aged Caucasian male. Awake and alert and oriented x3. No acute cardiopulmonary distress.  Eyes: Pupils equal, round, reactive to light. Extraocular muscles are intact. Sclerae anicteric and noninjected.  ENT:  Moist mucosal membranes. No mucosal lesions.   Neck: Neck supple without lymphadenopathy. No carotid bruits. No masses palpated.  Cardiovascular: Regular rate with normal S1-S2 sounds. No murmurs, rubs, gallops auscultated. No JVD.  Respiratory: Good respiratory effort with no wheezes, rales, rhonchi. Lungs clear to auscultation bilaterally.  Abdomen: Soft, nontender, nondistended. Active bowel sounds. No masses or hepatosplenomegaly  Skin: Dry, warm to touch. 2+ dorsalis pedis and radial pulses. Patient has a 5-6 cm stage I pressure ulcer in the sacral area. Musculoskeletal: No calf or leg pain. Patient moves very gingerly  Psychiatric: Intact judgment and insight.  Neurologic: No focal neurological deficits. Cranial nerves II through XII are grossly intact.           Labs on Admission:  Basic Metabolic Panel:  Recent Labs Lab 10/02/15 1351  NA 128*  K 4.7  CL 92*  CO2 23  GLUCOSE 100*  BUN 25*  CREATININE 0.82  CALCIUM 9.5   Liver Function Tests:  Recent Labs Lab 10/02/15 1351  AST 267*  ALT 67*  ALKPHOS 165*  BILITOT 2.4*  PROT 7.1  ALBUMIN 2.8*   No results for input(s): LIPASE, AMYLASE in the last 168 hours.  Recent Labs Lab  10/02/15 1351  AMMONIA 24   CBC:  Recent Labs Lab 09/30/15 0945 10/02/15 1351  WBC 10.1 5.9  NEUTROABS  --  4.9  HGB 12.3* 11.7*  HCT 38.7* 36.3*  MCV 84.7 85.0  PLT 212 165   Cardiac Enzymes: No results for input(s): CKTOTAL, CKMB, CKMBINDEX, TROPONINI in the last 168 hours.  BNP (last 3 results) No results for input(s): BNP in the last 8760 hours.  ProBNP (last 3 results) No results for input(s): PROBNP in the last 8760 hours.  CBG: No results for input(s): GLUCAP in the last 168 hours.  Radiological Exams on Admission: Dg Chest 2 View  10/02/2015  CLINICAL DATA:  Decreased appetite for 1 week, increased weakness beginning today, history bladder cancer, back pain, unable to bear weight LEFT leg, lumbar metastatic disease by MR, former smoker EXAM: CHEST  2 VIEW COMPARISON:  CT chest 09/23/2015 FINDINGS: Upper normal size of cardiac silhouette. Mediastinal contours and pulmonary vascularity normal. Central peribronchial thickening. Elevation of RIGHT diaphragm  with RIGHT basilar atelectasis. Lungs otherwise clear. No pleural effusion or pneumothorax. No definite acute osseous findings. IMPRESSION: Minimal bronchitic changes with RIGHT basilar atelectasis. Electronically Signed   By: Lavonia Dana M.D.   On: 10/02/2015 14:01    EKG: Independently reviewed. Normal sinus rhythm. No acute ST elevation or depressions.  Assessment/Plan Present on Admission:  . Lactic acidosis . Hyponatremia . Bacteriuria . Dehydration . Pressure ulcer, stage 1 . Elevated LFTs  This patient was discussed with the ED physician, including pertinent vitals, physical exam findings, labs, and imaging.  We also discussed care given by the ED provider.  #1 lactic acidosis  Admit to MedSurg  Recheck lactic acid level now and in 3 hours to evaluate improvement with IV fluids #2 hyponatremia  We'll recheck 6 hours from initial draw and tomorrow  Continue IV normal saline at 125 miles per  hour. #3 dehydration  Fluid status should be improved after 2 L bolus. We'll continue on maintenance fluids #4 bacteria  We'll culture. Hold treatment at this time as patient is asymptomatic #5 pressure ulcer stage I  Consult wound care  We'll start with barrier creams and air mattress #6 elevated LFTs  Recheck LFTs in the morning  We'll draw HIV and acute hepatic panel  DVT prophylaxis: Lovenox  Consultants: None  Code Status: Full code  Family Communication: Wife in the room   Disposition Plan: Admission   Truett Mainland, DO [facility] Pager [phone]

## 2015-10-02 NOTE — Progress Notes (Signed)
CRITICAL VALUE ALERT  Critical value received lactic acid  Date of notification: 4-2-+17  Time of notification:   Critical value read back: yes  Nurse who received alert: b Milana Obey   MD notified (1st page): stinson  Time of first page: 2215  MD notified (2nd page):  Time of second page:  Responding MD:  stinson  Time MD responded:  2220

## 2015-10-02 NOTE — ED Notes (Signed)
MD at bedside. 

## 2015-10-03 ENCOUNTER — Ambulatory Visit: Payer: Federal, State, Local not specified - PPO

## 2015-10-03 DIAGNOSIS — N390 Urinary tract infection, site not specified: Secondary | ICD-10-CM

## 2015-10-03 DIAGNOSIS — I10 Essential (primary) hypertension: Secondary | ICD-10-CM

## 2015-10-03 DIAGNOSIS — R531 Weakness: Secondary | ICD-10-CM

## 2015-10-03 DIAGNOSIS — E871 Hypo-osmolality and hyponatremia: Secondary | ICD-10-CM

## 2015-10-03 DIAGNOSIS — C7951 Secondary malignant neoplasm of bone: Secondary | ICD-10-CM

## 2015-10-03 DIAGNOSIS — R7989 Other specified abnormal findings of blood chemistry: Secondary | ICD-10-CM

## 2015-10-03 DIAGNOSIS — E872 Acidosis: Secondary | ICD-10-CM

## 2015-10-03 LAB — COMPREHENSIVE METABOLIC PANEL
ALT: 66 U/L — AB (ref 17–63)
AST: 277 U/L — AB (ref 15–41)
Albumin: 2.5 g/dL — ABNORMAL LOW (ref 3.5–5.0)
Alkaline Phosphatase: 154 U/L — ABNORMAL HIGH (ref 38–126)
Anion gap: 15 (ref 5–15)
BUN: 23 mg/dL — AB (ref 6–20)
CHLORIDE: 97 mmol/L — AB (ref 101–111)
CO2: 24 mmol/L (ref 22–32)
CREATININE: 0.74 mg/dL (ref 0.61–1.24)
Calcium: 9.3 mg/dL (ref 8.9–10.3)
GFR calc non Af Amer: >60 mL/min (ref >60)
Glucose, Bld: 118 mg/dL — ABNORMAL HIGH (ref 65–99)
POTASSIUM: 4.1 mmol/L (ref 3.5–5.1)
SODIUM: 136 mmol/L (ref 135–145)
Total Bilirubin: 2.7 mg/dL — ABNORMAL HIGH (ref 0.3–1.2)
Total Protein: 6.5 g/dL (ref 6.5–8.1)

## 2015-10-03 MED ORDER — HYDROMORPHONE HCL 1 MG/ML IJ SOLN
INTRAMUSCULAR | Status: AC
  Filled 2015-10-03: qty 1

## 2015-10-03 MED ORDER — SENNOSIDES-DOCUSATE SODIUM 8.6-50 MG PO TABS
1.0000 | ORAL_TABLET | Freq: Two times a day (BID) | ORAL | Status: DC
  Administered 2015-10-03 – 2015-10-04 (×3): 1 via ORAL
  Filled 2015-10-03 (×3): qty 1

## 2015-10-03 MED ORDER — HYDROMORPHONE HCL 1 MG/ML IJ SOLN
1.0000 mg | INTRAMUSCULAR | Status: DC | PRN
  Administered 2015-10-03 – 2015-10-04 (×3): 1 mg via INTRAVENOUS
  Filled 2015-10-03 (×2): qty 1

## 2015-10-03 MED ORDER — CIPROFLOXACIN IN D5W 400 MG/200ML IV SOLN
400.0000 mg | Freq: Two times a day (BID) | INTRAVENOUS | Status: DC
  Administered 2015-10-03 – 2015-10-04 (×3): 400 mg via INTRAVENOUS
  Filled 2015-10-03 (×3): qty 200

## 2015-10-03 MED ORDER — ALUM & MAG HYDROXIDE-SIMETH 200-200-20 MG/5ML PO SUSP
30.0000 mL | ORAL | Status: DC | PRN
  Administered 2015-10-03 (×2): 30 mL via ORAL
  Filled 2015-10-03 (×2): qty 30

## 2015-10-03 NOTE — Progress Notes (Addendum)
[facility] PROGRESS NOTE  [name] [id number] DOB: [birth year] DOA: 10/02/2015 PCP: Robert Bellow, MD    Code Status: Full code Family Communication: Discussed with wife and son Disposition Plan: Discharge to home in the next 24-48 hours.   Consultants:  None  Procedures:  None  Antibiotics:  Cipro 10/03/15>>  HPI/Subjective: Patient has chronic back pain and left hip pain. Acknowledges pain over his lower abdomen to palpation.  Objective: Filed Vitals:   10/02/15 2000 10/03/15 0400  BP: 127/49 153/61  Pulse:    Temp: 97.5 F (36.4 C) 98.2 F (36.8 C)  Resp: 20 18  Oxygen saturation 96%.  Intake/Output Summary (Last 24 hours) at 10/03/15 1336 Last data filed at 10/02/15 2200  Gross per 24 hour  Intake      0 ml  Output    300 ml  Net   -300 ml   Filed Weights   10/02/15 1254  Weight: 99.791 kg (220 lb)    Exam:   General:Ill-appearing 55 year old Caucasian man in no acute distress.  Cardiovascular: S1, S2, no murmurs rubs or gallops.  Respiratory: Clear anteriorly with decreased breath sounds in the bases.  Abdomen: Mildly obese, positive bowel sounds, moderately distended over the lower abdomen/bladder query full bladder; moderately tender.  Musculoskeletal/extremities: Moderate to exquisite discomfort when he tries to lift his left greater than right leg.  Neurologic/psychiatric: Sad affect, not engaging, does not make eye contact, but answers questions appropriately.   Data Reviewed: Basic Metabolic Panel:  Recent Labs Lab 10/02/15 1351 10/02/15 1935 10/03/15 0619  NA 128* 130* 136  K 4.7 4.5 4.1  CL 92* 96* 97*  CO2 23 21* 24  GLUCOSE 100* 102* 118*  BUN 25* 24* 23*  CREATININE 0.82 0.80 0.74  CALCIUM 9.5 9.1 9.3   Liver Function Tests:  Recent Labs Lab 10/02/15 1351 10/03/15 0619  AST 267* 277*  ALT 67* 66*  ALKPHOS 165* 154*  BILITOT 2.4* 2.7*  PROT 7.1 6.5  ALBUMIN 2.8* 2.5*   No results for  input(s): LIPASE, AMYLASE in the last 168 hours.  Recent Labs Lab 10/02/15 1351  AMMONIA 24   CBC:  Recent Labs Lab 09/30/15 0945 10/02/15 1351  WBC 10.1 5.9  NEUTROABS  --  4.9  HGB 12.3* 11.7*  HCT 38.7* 36.3*  MCV 84.7 85.0  PLT 212 165   Cardiac Enzymes: No results for input(s): CKTOTAL, CKMB, CKMBINDEX, TROPONINI in the last 168 hours. BNP (last 3 results) No results for input(s): BNP in the last 8760 hours.  ProBNP (last 3 results) No results for input(s): PROBNP in the last 8760 hours.  CBG: No results for input(s): GLUCAP in the last 168 hours.  Recent Results (from the past 240 hour(s))  Urine culture     Status: None (Preliminary result)   Collection Time: 10/02/15  1:33 PM  Result Value Ref Range Status   Specimen Description URINE, CLEAN CATCH  Final   Special Requests NONE  Final   Culture   Final    NO GROWTH < 24 HOURS Performed at [facility]    Report Status PENDING  Incomplete     Studies: Dg Chest 2 View  10/02/2015  CLINICAL DATA:  Decreased appetite for 1 week, increased weakness beginning today, history bladder cancer, back pain, unable to bear weight LEFT leg, lumbar metastatic disease by MR, former smoker EXAM: CHEST  2 VIEW COMPARISON:  CT chest 09/23/2015 FINDINGS: Upper normal size of cardiac silhouette. Mediastinal contours and  pulmonary vascularity normal. Central peribronchial thickening. Elevation of RIGHT diaphragm with RIGHT basilar atelectasis. Lungs otherwise clear. No pleural effusion or pneumothorax. No definite acute osseous findings. IMPRESSION: Minimal bronchitic changes with RIGHT basilar atelectasis. Electronically Signed   By: Lavonia Dana M.D.   On: 10/02/2015 14:01    Scheduled Meds: . ALPRAZolam  1 mg Oral Daily  . atenolol  50 mg Oral q morning - 10a  . ciprofloxacin  400 mg Intravenous Q12H  . enoxaparin (LOVENOX) injection  40 mg Subcutaneous Q24H  . HYDROmorphone      . morphine  45 mg Oral Q12H  .  senna-docusate  1 tablet Oral BID   Continuous Infusions: . sodium chloride 125 mL/hr at 10/03/15 0835   Assessment and plan: Principal Problem:   Generalized weakness Active Problems:   Spine metastasis (HCC)   Hyponatremia   Dehydration   Lactic acidosis   UTI (lower urinary tract infection)   Elevated LFTs   Essential hypertension   Bladder cancer (HCC)   Pressure ulcer, sacrum  1. Generalized weakness and failure to thrive. Etiology likely multifactorial including dehydration, UTI, and widespread bony metastasis. Patient was started on IV fluids and analgesics as needed. His wife declines patient for PT evaluation as the patient cannot walk due to destructive metastatic lesion on his left femur.  Widespread bony metastasis Patient was recently diagnosed with widespread bony metastasis. He has metastatic lesions widespread in his thoracic and lumbar spine, left femur, and pelvis. Primary etiology is unknown although he has a history of bladder cancer. He is status post biopsy of L3 paraspinal mass, by IR on 09/30/15. Pathology results pending. -Patient is followed by radiation oncologist, Dr. Tammi Klippel. His wife states that he is had 9 sessions of radiation. -She is treated with MS Contin every 12 hours and as needed oxycodone. Dose of MS Contin was increased to 45 mg every 12 hours. As needed IV hydromorphone was ordered.  Urinary tract infection. Patient urinalysis revealed bacteriuria. Urine culture ordered on admission. Antibiotics not started presumably because the patient was reported to be asymptomatic. Patient does voice some pain with urination. Therefore, Cipro will be started.  Hyponatremia secondary to dehydration and hypovolemia. Patient's serum sodium was 128 on admission. He was started on IV fluids with normal saline. His serum sodium has normalized. We'll continue IV fluids.  Essential hypertension. Patient is treated chronically with atenolol. It was restarted.  His blood pressures trending up. Will decrease IV fluid rate.  Lactic acidosis. Likely from dehydration. Lactic acid level is trending downward.  Elevated LFTs. Patient's AST was 267, alkaline phosphatase 165, ALT 67, and total bilirubin was 2.4 on admission. The elevation is likely secondary to widespread metastatic liver lesions. Acute viral hepatitis panel pending. We'll continue supportive treatment.  Unstageable sacral ulcer. Wound care nurse was consulted. Recommendations for wound care noted and appreciated.    Time spent: 35 minutes    [facility] Pager [phone]. If 7PM-7AM, please contact night-coverage at [email or web address], password [facility] 10/03/2015, 1:36 PM  LOS: 1 day

## 2015-10-03 NOTE — Consult Note (Signed)
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 Recent decline in intake.  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  Patient screams in pain when turned for assessment. Will treat conservatively at this time.  Wound type:Unstageable pressure injury Pressure Ulcer POA: Yes Measurement: 2 cm x 2 cm unable to visualize wound bed due to the presence of devitalized tissue.  Wound bed:50% eschar, 50% pale pink Drainage (amount, consistency, odor) MInimal serosanguinous drainage.  Periwound:Erythema Dressing procedure/placement/frequency:Cleanse sacral wound with NS and pat gently dry. Allevyn silicone border foam dressing.  Change every 3 days and PRN soilage.  Will not follow at this time.  Please re-consult if needed.  Domenic Moras RN BSN [facility] Pager [phone]

## 2015-10-04 ENCOUNTER — Encounter (HOSPITAL_COMMUNITY): Payer: Self-pay | Admitting: Internal Medicine

## 2015-10-04 ENCOUNTER — Telehealth: Payer: Self-pay | Admitting: Radiation Oncology

## 2015-10-04 ENCOUNTER — Other Ambulatory Visit: Payer: Self-pay | Admitting: Radiation Oncology

## 2015-10-04 ENCOUNTER — Ambulatory Visit: Payer: Federal, State, Local not specified - PPO

## 2015-10-04 DIAGNOSIS — L89150 Pressure ulcer of sacral region, unstageable: Secondary | ICD-10-CM

## 2015-10-04 DIAGNOSIS — C670 Malignant neoplasm of trigone of bladder: Secondary | ICD-10-CM

## 2015-10-04 DIAGNOSIS — E860 Dehydration: Principal | ICD-10-CM

## 2015-10-04 DIAGNOSIS — C7951 Secondary malignant neoplasm of bone: Secondary | ICD-10-CM

## 2015-10-04 LAB — COMPREHENSIVE METABOLIC PANEL
ALK PHOS: 169 U/L — AB (ref 38–126)
ALT: 71 U/L — AB (ref 17–63)
AST: 301 U/L — AB (ref 15–41)
Albumin: 2.4 g/dL — ABNORMAL LOW (ref 3.5–5.0)
Anion gap: 14 (ref 5–15)
BUN: 28 mg/dL — AB (ref 6–20)
CALCIUM: 9.4 mg/dL (ref 8.9–10.3)
CO2: 22 mmol/L (ref 22–32)
CREATININE: 0.86 mg/dL (ref 0.61–1.24)
Chloride: 101 mmol/L (ref 101–111)
GFR calc non Af Amer: >60 mL/min (ref >60)
GLUCOSE: 132 mg/dL — AB (ref 65–99)
Potassium: 4.8 mmol/L (ref 3.5–5.1)
SODIUM: 137 mmol/L (ref 135–145)
Total Bilirubin: 2.5 mg/dL — ABNORMAL HIGH (ref 0.3–1.2)
Total Protein: 6.4 g/dL — ABNORMAL LOW (ref 6.5–8.1)

## 2015-10-04 LAB — CBC
HCT: 35.8 % — ABNORMAL LOW (ref 39.0–52.0)
Hemoglobin: 11 g/dL — ABNORMAL LOW (ref 13.0–17.0)
MCH: 26.7 pg (ref 26.0–34.0)
MCHC: 30.7 g/dL (ref 30.0–36.0)
MCV: 86.9 fL (ref 78.0–100.0)
PLATELETS: 160 10*3/uL (ref 150–400)
RBC: 4.12 MIL/uL — AB (ref 4.22–5.81)
RDW: 15.3 % (ref 11.5–15.5)
WBC: 9.1 10*3/uL (ref 4.0–10.5)

## 2015-10-04 LAB — HEPATITIS PANEL, ACUTE
HCV Ab: <0.1 s/co ratio (ref 0.0–0.9)
HEP B S AG: NEGATIVE
Hep A IgM: NEGATIVE
Hep B C IgM: NEGATIVE

## 2015-10-04 LAB — HIV ANTIBODY (ROUTINE TESTING W REFLEX): HIV Screen 4th Generation wRfx: NONREACTIVE

## 2015-10-04 LAB — URINE CULTURE

## 2015-10-04 MED ORDER — SENNOSIDES-DOCUSATE SODIUM 8.6-50 MG PO TABS: 1.0000 | ORAL_TABLET | Freq: Two times a day (BID) | ORAL | Status: AC

## 2015-10-04 MED ORDER — CIPROFLOXACIN HCL 500 MG PO TABS: 500.0000 mg | ORAL_TABLET | Freq: Two times a day (BID) | ORAL | Status: AC

## 2015-10-04 MED ORDER — BARRIER CREAM NON-SPECIFIED: 1.0000 "application " | TOPICAL_CREAM | Freq: Two times a day (BID) | TOPICAL | Status: AC | PRN

## 2015-10-04 MED ORDER — MEGESTROL ACETATE 20 MG PO TABS: 20.0000 mg | ORAL_TABLET | Freq: Two times a day (BID) | ORAL | Status: AC

## 2015-10-04 MED ORDER — POLYETHYLENE GLYCOL 3350 17 G PO PACK: 17.0000 g | PACK | Freq: Every day | ORAL | Status: AC | PRN

## 2015-10-04 NOTE — Progress Notes (Signed)
EMS here to transport patient home. Condom cath removed. Patient in stable condition at this time.

## 2015-10-04 NOTE — Discharge Summary (Signed)
Physician Discharge Summary  [name] DOB: 10/12/1960 DOA: 10/02/2015  PCP: Robert Bellow, MD  Admit date: 10/02/2015 Discharge date: 10/04/2015  Time spent: Greater than 30 minutes  Recommendations for Outpatient Follow-up:  1. Patient is being discharged to home with hospice services.     Discharge Diagnoses:  1. Generalized weakness and failure to thrive secondary to metastatic cancer, dehydration, and UTI. 2. 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 3. History of bladder cancer. 4. Hyponatremia secondary to dehydration and hypovolemia. 5. Urinary tract infection. 6. Lactic acidosis, secondary to dehydration. 7. Elevated LFTs secondary to liver metastasis. 8. Unstageable sacral ulcer.   Discharge Condition: Stable but terminal  Diet recommendation: As tolerated  Filed Weights   10/02/15 1254  Weight: 99.791 kg (220 lb)    History of present illness:  Patient is a 55 year old man with a history of bladder cancer, recent diagnosis of metastatic cancer to the bone and liver without a definite primary, hypertension, and hyperlipidemia, who presented to the ED on 10/02/2015 with a chief complaint of weakness.  Hospital Course:   1. Generalized weakness and failure to thrive. Etiology likely multifactorial including dehydration, UTI, and widespread bony metastasis. Patient was started on IV fluids and analgesics as needed. His wife declined patient for PT evaluation he could not walk due to destructive metastatic lesion on his left femur.  Widespread bony and liver metastasis, secondary to urothelial cancer. Patient was recently diagnosed with widespread bony metastasis. He has metastatic lesions widespread in his thoracic and lumbar spine, left femur, and pelvis and liver. Primary etiology was unknown although he has a history of bladder cancer. He is status post biopsy of L3  paraspinal mass, by IR on 09/30/15. -Patient is followed by radiation oncologist, Dr. Tammi Klippel. His wife stated that he is had 9 sessions of radiation. -He is treated with MS Contin every 12 hours and as needed oxycodone. Dose of MS Contin was increased to 45 mg every 12 hours. As needed IV hydromorphone was ordered. -Rad/Onc PA. Ms. 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 After she discussed the findings with oncologist, Dr. Alen Blew, she recommended a meeting Dr. Whitney Muse at [facility] for treatment. However, the patient wanted no further radiation treatments or consideration for chemotherapy. Both he and his wife wanted to pursue hospice. At the time of discharge, hospice met with the patient and family and hospice has been set up for home.   Urinary tract infection. Patient urinalysis revealed bacteriuria. Urine culture ordered on admission. Antibiotics not started presumably because the patient was reported to be asymptomatic. Patient did voice some pain with urination. Therefore, Cipro was started and continued outpatient treatment for total of 7 days.  Hyponatremia secondary to dehydration and hypovolemia. Patient's serum sodium was 128 on admission. He was started on IV fluids with normal saline. His serum sodium normalized.  Essential hypertension. Patient is treated chronically with atenolol. It was restarted. His blood pressure was stable.  Lactic acidosis. Likely from dehydration. Lactic acid level trended downward.  Elevated LFTs. Patient's AST was 267, alkaline phosphatase 165, ALT 67, and total bilirubin was 2.4 on admission. The elevation is likely secondary to widespread metastatic liver lesions. Acute viral hepatitis panel was negative.  Unstageable sacral ulcer. Wound care nurse was consulted. Recommendations for wound care noted and appreciated.    Procedures:  None  Consultations:  Wound care  nurse  Discharge Exam: Filed Vitals:   10/04/15 0437 10/04/15 0907  BP: 138/89 134/65  Pulse: 87 86  Temp: 98.5 F (36.9 C) 98.4 F (36.9 C)  Resp: 20 20    General: 55 year old Caucasian man who looks a little better this morning, but chronically debilitated. Cardiovascular: S1, S2, no murmurs rubs or gallops. Respiratory: Clear to auscultation bilaterally.  Discharge Instructions   Discharge Instructions    Diet - low sodium heart healthy    Complete by:  As directed      Discharge instructions    Complete by:  As directed   [place] DR. Theba Karie Kirks. TRY TO KEEP PERSISTENT PRESSURE OFF OF YOUR SACRUM DUE TO THE SKIN BREAKDOWN.     Increase activity slowly    Complete by:  As directed           Current Discharge Medication List    START taking these medications   Details  barrier cream (NON-SPECIFIED) CREA Apply 1 application topically 2 (two) times daily as needed.    ciprofloxacin (CIPRO) 500 MG tablet Take 1 tablet (500 mg total) by mouth 2 (two) times daily. ANTIBIOTIC TO BE TAKEN AS DIRECTED UNTIL COMPLETED. Qty: 12 tablet, Refills: 0    megestrol (MEGACE) 20 MG tablet Take 1 tablet (20 mg total) by mouth 2 (two) times daily. FOR APPETITE STIMULATION. Qty: 60 tablet, Refills: 0    polyethylene glycol (MIRALAX / GLYCOLAX) packet Take 17 g by mouth daily as needed for mild constipation.    senna-docusate (SENOKOT-S) 8.6-50 MG tablet Take 1 tablet by mouth 2 (two) times daily.      CONTINUE these medications which have NOT CHANGED   Details  ALPRAZolam (XANAX) 1 MG tablet Take 1 mg by mouth daily.    atenolol (TENORMIN) 50 MG tablet Take 50 mg by mouth every morning.     morphine (MS CONTIN) 30 MG 12 hr tablet Take 1 tablet (30 mg total) by mouth every 12 (twelve) hours. Qty: 60 tablet, Refills: 0    Multiple Vitamins-Minerals (ONE-A-DAY MENS 50+ ADVANTAGE) TABS Take 1 tablet by mouth daily.    oxyCODONE-acetaminophen  (PERCOCET) 10-325 MG tablet take 1/2 to 1 tablet UP TO four times a day for SEVERE PAIN Refills: 0    promethazine (PHENERGAN) 25 MG tablet Take 1 tablet (25 mg total) by mouth every 6 (six) hours as needed for nausea or vomiting. Qty: 60 tablet, Refills: 0   Associated Diagnoses: Bone metastasis ([place]); Spine metastasis ([place])      STOP taking these medications     docusate sodium (COLACE) 100 MG capsule        Allergies  Allergen Reactions  . Enbrel [Etanercept] Rash   Follow-up Information    Follow up with Robert Bellow, MD In 2 weeks.   Specialty:  Family Medicine   Contact information:   [place] [address] [phone]       Follow up with Tyler Pita, MD.   Specialty:  Radiation Oncology   Why:  AS SCHEDULED   Contact information:   [place] [address] [phone]        The results of significant diagnostics from this hospitalization (including imaging, microbiology, ancillary and laboratory) are listed below for reference.    Significant Diagnostic Studies: Dg Chest 2 View  10/02/2015  CLINICAL DATA:  Decreased appetite for 1 week, increased weakness beginning today, history bladder cancer, back pain, unable to bear weight  LEFT leg, lumbar metastatic disease by MR, former smoker EXAM: CHEST  2 VIEW COMPARISON:  CT chest 09/23/2015 FINDINGS: Upper normal size of cardiac silhouette. Mediastinal contours and pulmonary vascularity normal. Central peribronchial thickening. Elevation of RIGHT diaphragm with RIGHT basilar atelectasis. Lungs otherwise clear. No pleural effusion or pneumothorax. No definite acute osseous findings. IMPRESSION: Minimal bronchitic changes with RIGHT basilar atelectasis. Electronically Signed   By: Lavonia Dana M.D.   On: 10/02/2015 14:01   Dg Lumbar Spine Complete  09/14/2015  CLINICAL DATA:  Low back pain for 2 years. Occasional left sciatica. EXAM: LUMBAR SPINE - COMPLETE 4+ VIEW  COMPARISON:  Report of radiographs dated 08/14/2013 and abdominal images dated 08/31/2013 and radiographs dated 11/23/2005 and CT scan of the abdomen and pelvis dated 04/16/2013 the FINDINGS: There is abnormal lucency in the region of the right pedicle of L5. This appears to represent a change since the prior exams. The possibility of a focal destructive lesion should be considered. Slight facet arthritis at L4-5. No disc space narrowing or subluxation. Extensive calcification in the abdominal aorta and iliac arteries. IMPRESSION: Possible destructive lesion in the right pedicle of L5. This is not definitive but that area appears different than on multiple prior exams. I recommend MRI of the lumbar spine with and without contrast for further evaluation. Electronically Signed   By: Lorriane Shire M.D.   On: 09/14/2015 14:05   Ct Chest W Contrast  09/23/2015  CLINICAL DATA:  Bladder cancer EXAM: CT CHEST, ABDOMEN, AND PELVIS WITH CONTRAST TECHNIQUE: Multidetector CT imaging of the chest, abdomen and pelvis was performed following the standard protocol during bolus administration of intravenous contrast. CONTRAST:  170m ISOVUE-300 IOPAMIDOL (ISOVUE-300) INJECTION 61% COMPARISON:  04/16/2013 FINDINGS: CT CHEST No abnormal mediastinal adenopathy. Atherosclerotic calcifications are noted. Mild LAD territory coronary artery calcification. Mild aortic valve calcification. No pneumothorax.  No pleural effusion Dependent subsegmental atelectasis.  No evidence of lung nodule. There is a destructive lesion in the posterior aspect of T5 which breaks through the posterior cortex. Epidural tumor on the right side is present. There is also a lytic bone lesion within the posterior T7 vertebral body. Common T8 posterior vertebral body, T10 vertebral body, and T11 vertebral body. CT ABDOMEN AND PELVIS The right lobe of the liver is diffusely infiltrated with heterogeneous mass effect. This occupies an area 19.5 x 13.2 cm on image  57. The left lobe of the liver is relatively spared. There is a 2.4 cm lesion in the medial segment of the left lobe on image 62. There is also a smaller ill-defined lesion in the lateral segment of the left lobe on image 55. Nearly the entire right lobe is involved. Spleen is mildly enlarged without obvious focal mass Kidneys pancreas, and adrenal glands are within normal limits. Cholelithiasis. Abnormal retroperitoneal adenopathy. 15 mm short axis diameter left para-aortic lymph node on image 63. Other adjacent smaller lymph nodes are noted. Abnormal left external iliac adenopathy. 1.9 cm left external iliac node on image 114. Adjacent 13 mm lymph node. 15 mm left obturator node on image 121. Atherosclerotic calcifications involve the aorta, iliac arteries, and visceral vasculature. No free-fluid. Ventral hernia to the right of midline in the anterior abdomen contains adipose tissue. Widespread metastatic disease involves the lumbar vertebral bodies as noted on a recent MRI. Large lesion and invades the right side of L3 and right psoas muscle. There is a lesion in the right side of the L5 vertebral body extending into the pedicle. This does  cause cortical breakthrough. There is a destructive lesion in the left femoral neck the anterior cortex of the femoral neck is intact. The superior cortex has been nearly completely destroyed. There is a destructive lesion in the left inferior pubic ramus. There is also a lesion in the right pubic body and left pubic body IMPRESSION: Other than bony disease, there is no evidence of metastatic disease in the thorax. Metastatic disease in the thoracic spine is seen at multiple levels. At T5, there is a posterior vertebral body lesion with cortical breakthrough and epidural tumor Extensive metastatic disease throughout the liver. Nearly the entire right lobe is occupied by tumor Abnormal left para-aortic and left iliac adenopathy Widespread metastatic disease in the lumbar spine,  pelvis, and left femoral neck as described. Cholelithiasis. Electronically Signed   By: Marybelle Killings M.D.   On: 09/23/2015 15:50   Mr Jeri Cos EY Contrast  09/26/2015  CLINICAL DATA:  55 year old male with bladder cancer and mental status changes. Subsequent encounter. EXAM: MRI HEAD WITHOUT AND WITH CONTRAST TECHNIQUE: Multiplanar, multiecho pulse sequences of the brain and surrounding structures were obtained without and with intravenous contrast. CONTRAST:  20 cc MultiHance. COMPARISON:  No comparison brain MR. Thoracic spine MR 09/26/2015 dictated separately. Comparison lumbar spine MR 09/14/2015. Comparison cervical spine MR 07/31/2018 2009. FINDINGS: No acute infarct or intracranial hemorrhage. Left frontal calvarium 1.3 cm expansile lesion. Right parietal calvarial 6 mm enhancing lesion. Right occipital condyle 1 cm enhancing lesion. These findings are consistent with osseous metastatic disease with adjacent dural enhancement. Clivus and upper cervical vertebra of diffuse altered signal intensity. This is noted on remote cervical spine MR and it is possible is normal for patient or related to anemia rather than representing osseous metastatic disease. Attention to this on follow. No brain parenchymal enhancing lesion. Subtle FLAIR altered signal intensity near the aqueduct. Question if this may reflect changes of paraneoplastic syndrome versus result of treatment of tumor. Mild global atrophy without hydrocephalus. Major intracranial vascular structures are patent. Minimal partial opacification left mastoid air cells. Minimal polypoid opacification posterior left maxillary sinus. No orbital abnormality noted. Cervical medullary junction within normal limits. IMPRESSION: Left frontal calvarium 1.3 cm expansile lesion. Right parietal calvarial 6 mm enhancing lesion. Right occipital condyle 1 cm enhancing lesion. These findings are consistent with osseous metastatic disease with adjacent dural enhancement.  Clivus and upper cervical vertebra of diffuse altered signal intensity. This is noted on remote cervical spine MR and it is possible is normal for patient or related to anemia rather than representing osseous metastatic disease. Attention to this on follow-up. No brain parenchymal enhancing lesion. Subtle FLAIR altered signal intensity near the aqueduct. Question if this may reflect changes of paraneoplastic syndrome versus result of treatment of tumor. Mild global atrophy without hydrocephalus. No acute infarct or intracranial hemorrhage. Minimal partial opacification left mastoid air cells. Minimal polypoid opacification posterior left maxillary sinus. Electronically Signed   By: Genia Del M.D.   On: 09/26/2015 16:54   Mr Thoracic Spine W Wo Contrast  09/26/2015  CLINICAL DATA:  Bladder cancer. Lumbar metastases. Mental status changes. Abnormal chest CT. EXAM: MRI THORACIC SPINE WITHOUT AND WITH CONTRAST TECHNIQUE: Multiplanar and multiecho pulse sequences of the thoracic spine were obtained without and with intravenous contrast. CONTRAST:  6m MULTIHANCE GADOBENATE DIMEGLUMINE 529 MG/ML IV SOLN COMPARISON:  CT the chest 09/23/2015. FINDINGS: Normal signal is present in the thoracic spinal cord. Conus medullaris terminates at L1, within normal limits. Volume loss and atelectasis is  present at the right lung base. Diffuse metastases are again noted throughout the right lobe of the liver. There is diffuse heterogeneous enhancement within the T5 vertebral body. Extraosseous extension of tumor extends into the spinal canal effaces the ventral CSF, right greater than left. There is mild narrowing of the right T5-6 foramen due to tumor. The posterior canal and left foramen are patent. A hemangioma is present at T1. Punctate foci of enhancement are present centrally at T3 and on the right at T4. There is more diffuse enhancement on the left at T4 extending to the pedicle. Peripheral enhancement is associated with  hemangiomas at T7 and T9. A punctate lesion is present just left of midline at T7. A 17 mm lesion is present superiorly and posteriorly at T8. A 12 mm lesion is present at the base the right pedicle at T9. Enhancing lesion posteriorly in centrally at T10 is measures 13 x 17 mm. The lesion posteriorly and inferiorly at T12 is better seen on the lumbar spine MRI. Enhancement is limited given that it is at the edge of the field of view. No significant focal disc protrusion is present. No other significant foraminal stenosis is evident. IMPRESSION: 1. Multi-level metastatic disease of the thoracic spine. 2. The most prominent level is T5 for extraosseous tumor extends into the ventral CSF, right greater than left. There is partial effacement of the ventral CSF at this level without abnormal cord signal. 3. Mild right foraminal narrowing at T5-6 due to tumor encroachment. 4. Additional metastatic disease is present at T3, T4, T7, T8, T9,, T10, and T12. 5. Extensive metastatic disease to the liver. 6. Atelectasis or scarring at the right lung base. Electronically Signed   By: San Morelle M.D.   On: 09/26/2015 16:27   Mr Lumbar Spine W Wo Contrast  09/14/2015  CLINICAL DATA:  Personal history of bladder cancer. Low back pain, 2 years duration with left leg radiating pain. EXAM: MRI LUMBAR SPINE WITHOUT AND WITH CONTRAST TECHNIQUE: Multiplanar and multiecho pulse sequences of the lumbar spine were obtained without and with intravenous contrast. CONTRAST:  40m MULTIHANCE GADOBENATE DIMEGLUMINE 529 MG/ML IV SOLN COMPARISON:  Radiography same day.  CT 04/16/2013. FINDINGS: There is widespread and extensive osseous metastatic disease. The examination covers the region from T9 through S3. There are foci of metastatic disease affecting every vertebral level. This is most advanced within the T10 vertebral body, the L3 vertebral body and the right side of the L5 vertebral body and posterior elements on the right as  suggested at plain radiography. No extraosseous tumor, primary neural metastasis or nerve compression is demonstrated at L2 or above. At L3, tumor extends outward from the right side of vertebral body into the psoas muscle. There is mild encroachment in the right extra foraminal region that could affect the right L3 nerve root. This tumor mass measures approximately 4 cm in diameter. At the L5 level, tumor on the right within the pedicle measures approximately 3 cm in diameter, and does encroach upon the intervertebral foramina on the right at both L4-5 and L5-S1 and could affect those nerves. There are numerous small retroperitoneal lymph nodes, most on the order of 1 cm in size consistent with metastatic nodal disease. IMPRESSION: Widespread osseous metastatic disease. No spinal canal encroachment. Tumor extends from the right side of the vertebral body at L3 into the right psoas muscle, measuring about 4 cm in diameter. Tumor in the right pedicle of L5 encroaches upon the intervertebral foramina on the  right at L4-5 and L5-S1 and could affect those exiting nerves. Retroperitoneal lymphadenopathy. Electronically Signed   By: Nelson Chimes M.D.   On: 09/14/2015 20:04   Ct Abdomen Pelvis W Contrast  09/23/2015  CLINICAL DATA:  Bladder cancer EXAM: CT CHEST, ABDOMEN, AND PELVIS WITH CONTRAST TECHNIQUE: Multidetector CT imaging of the chest, abdomen and pelvis was performed following the standard protocol during bolus administration of intravenous contrast. CONTRAST:  128m ISOVUE-300 IOPAMIDOL (ISOVUE-300) INJECTION 61% COMPARISON:  04/16/2013 FINDINGS: CT CHEST No abnormal mediastinal adenopathy. Atherosclerotic calcifications are noted. Mild LAD territory coronary artery calcification. Mild aortic valve calcification. No pneumothorax.  No pleural effusion Dependent subsegmental atelectasis.  No evidence of lung nodule. There is a destructive lesion in the posterior aspect of T5 which breaks through the posterior  cortex. Epidural tumor on the right side is present. There is also a lytic bone lesion within the posterior T7 vertebral body. Common T8 posterior vertebral body, T10 vertebral body, and T11 vertebral body. CT ABDOMEN AND PELVIS The right lobe of the liver is diffusely infiltrated with heterogeneous mass effect. This occupies an area 19.5 x 13.2 cm on image 57. The left lobe of the liver is relatively spared. There is a 2.4 cm lesion in the medial segment of the left lobe on image 62. There is also a smaller ill-defined lesion in the lateral segment of the left lobe on image 55. Nearly the entire right lobe is involved. Spleen is mildly enlarged without obvious focal mass Kidneys pancreas, and adrenal glands are within normal limits. Cholelithiasis. Abnormal retroperitoneal adenopathy. 15 mm short axis diameter left para-aortic lymph node on image 63. Other adjacent smaller lymph nodes are noted. Abnormal left external iliac adenopathy. 1.9 cm left external iliac node on image 114. Adjacent 13 mm lymph node. 15 mm left obturator node on image 121. Atherosclerotic calcifications involve the aorta, iliac arteries, and visceral vasculature. No free-fluid. Ventral hernia to the right of midline in the anterior abdomen contains adipose tissue. Widespread metastatic disease involves the lumbar vertebral bodies as noted on a recent MRI. Large lesion and invades the right side of L3 and right psoas muscle. There is a lesion in the right side of the L5 vertebral body extending into the pedicle. This does cause cortical breakthrough. There is a destructive lesion in the left femoral neck the anterior cortex of the femoral neck is intact. The superior cortex has been nearly completely destroyed. There is a destructive lesion in the left inferior pubic ramus. There is also a lesion in the right pubic body and left pubic body IMPRESSION: Other than bony disease, there is no evidence of metastatic disease in the thorax.  Metastatic disease in the thoracic spine is seen at multiple levels. At T5, there is a posterior vertebral body lesion with cortical breakthrough and epidural tumor Extensive metastatic disease throughout the liver. Nearly the entire right lobe is occupied by tumor Abnormal left para-aortic and left iliac adenopathy Widespread metastatic disease in the lumbar spine, pelvis, and left femoral neck as described. Cholelithiasis. Electronically Signed   By: AMarybelle KillingsM.D.   On: 09/23/2015 15:50   Ct Biopsy  09/30/2015  INDICATION: Right L3 paraspinous metastasis, history of bladder cancer. EXAM: CT-GUIDED BIOPSY RIGHT L3 PARASPINOUS MASS MEDICATIONS: 1% LIDOCAINE LOCALLY ANESTHESIA/SEDATION: 1.0 mg IV Versed; 100 mcg IV Fentanyl Moderate Sedation Time:  15 MINUTES The patient was continuously monitored during the procedure by the interventional radiology nurse under my direct supervision. PROCEDURE: The procedure, risks, benefits,  and alternatives were explained to the patient. Questions regarding the procedure were encouraged and answered. The patient understands and consents to the procedure. The right posterior back was prepped with ChloraPrep in a sterile fashion, and a sterile drape was applied covering the operative field. A sterile gown and sterile gloves were used for the procedure. Previous imaging reviewed. Patient positioned supine. Noncontrast localization CT performed. The L3 right paraspinous lytic destructive bone mass was localized. Under sterile conditions and local anesthesia, a 17 gauge 11.8 cm access needle was advanced from a right posterior oblique approach to the soft tissue mass. Needle position confirmed with CT. 18 gauge core biopsies performed of the right paraspinous soft tissue mass. Samples placed in formalin. Needle tract embolized with Gel-Foam.  Needle removed. Patient tolerated the procedure well without complication. Vital sign monitoring by nursing staff during the procedure will  continue as patient is in the special procedures unit for post procedure observation. FINDINGS: The images document guide needle placement within the right L3 paraspinous mass. Post biopsy images demonstrate no hemorrhage or hematoma. COMPLICATIONS: None immediate. IMPRESSION: Successful CT-guided core biopsy of the right L3 paraspinous mass Electronically Signed   By: Jerilynn Mages.  Shick M.D.   On: 09/30/2015 13:01    Microbiology: Recent Results (from the past 240 hour(s))  Urine culture     Status: None (Preliminary result)   Collection Time: 10/02/15  1:33 PM  Result Value Ref Range Status   Specimen Description URINE, CLEAN CATCH  Final   Special Requests NONE  Final   Culture   Final    NO GROWTH < 24 HOURS Performed at [facility]    Report Status PENDING  Incomplete     Labs: Basic Metabolic Panel:  Recent Labs Lab 10/02/15 1351 10/02/15 1935 10/03/15 0619 10/04/15 0527  NA 128* 130* 136 137  K 4.7 4.5 4.1 4.8  CL 92* 96* 97* 101  CO2 23 21* 24 22  GLUCOSE 100* 102* 118* 132*  BUN 25* 24* 23* 28*  CREATININE 0.82 0.80 0.74 0.86  CALCIUM 9.5 9.1 9.3 9.4   Liver Function Tests:  Recent Labs Lab 10/02/15 1351 10/03/15 0619 10/04/15 0527  AST 267* 277* 301*  ALT 67* 66* 71*  ALKPHOS 165* 154* 169*  BILITOT 2.4* 2.7* 2.5*  PROT 7.1 6.5 6.4*  ALBUMIN 2.8* 2.5* 2.4*   No results for input(s): LIPASE, AMYLASE in the last 168 hours.  Recent Labs Lab 10/02/15 1351  AMMONIA 24   CBC:  Recent Labs Lab 09/30/15 0945 10/02/15 1351 10/04/15 0527  WBC 10.1 5.9 9.1  NEUTROABS  --  4.9  --   HGB 12.3* 11.7* 11.0*  HCT 38.7* 36.3* 35.8*  MCV 84.7 85.0 86.9  PLT 212 165 160   Cardiac Enzymes: No results for input(s): CKTOTAL, CKMB, CKMBINDEX, TROPONINI in the last 168 hours. BNP: BNP (last 3 results) No results for input(s): BNP in the last 8760 hours.  ProBNP (last 3 results) No results for input(s): PROBNP in the last 8760 hours.  CBG: No results  for input(s): GLUCAP in the last 168 hours.     Signed:  Maisie Hauser MD.  [facility] 10/04/2015, 11:57 AM

## 2015-10-04 NOTE — Progress Notes (Signed)
Pt's IV catheter removed and intact. Pt's IV catheter site clean dry and intact. Per MD okay to leave foley catheter in place. Pt's foley emptied and intact. Discharge instructions, medications and follow up appointments reviewed and discussed with patient's wife. Pt's wife verbalized understanding of discharge instructions. All questions were answered and no further questions at this time. Pt in stable condition and in no acute distress at time of discharge. Pt will be escorted by [facility].

## 2015-10-04 NOTE — Care Management Note (Signed)
Case Management Note  Patient Details  Name: [name] MRN: [id number] Date of Birth: July 13, 1960  Subjective/Objective:                  Pt admitted with generalized weakness. Pt is from home and recently dx with met cancer. Pt discharging home today with hospice services. Per pt's wife another medical provider has already made arrangements with hospice and DME is being delivered to the home this afternoon. Pt will need EMS transport home. Per pt's wife they have no needs at this time.   Action/Plan: Pt discharging home today with hospice services  - wife does not know what agency has been referred. CM will make arrangements for EMS once DME has been delivered to home.   Expected Discharge Date:  10/04/15               Expected Discharge Plan:  Home w Hospice Care  In-House Referral:  NA  Discharge planning Services  CM Consult  Post Acute Care Choice:    Choice offered to:     DME Arranged:    DME Agency:     HH Arranged:    HH Agency:     Status of Service:  Completed, signed off  Medicare Important Message Given:    Date Medicare IM Given:    Medicare IM give by:    Date Additional Medicare IM Given:    Additional Medicare Important Message give by:     If discussed at [place] of Stay Meetings, dates discussed:    Additional Comments:  Sherald Barge, RN 10/04/2015, 2:54 PM

## 2015-10-04 NOTE — Telephone Encounter (Signed)
I spoke with the patient's wife today about the biopsy findings and shared my discussion with her from speaking with Dr. Alen Blew. He suggested meeting with Dr. Whitney Muse at [facility] to discuss single agent cisplatin or immunotherapy. Mrs. Laplante said that she is concerned that he cannot walk due to weakness and sleepiness. She requests that we cancel his upcoming radiation treatments, and states that he would like to move forward with hospice. She indicates that he has also verbalized readiness for death and has said goodbyes to family members. I offered emotional support and have encouraged her to keep [place] informed of any changes in his status.   Shona Simpson, [facility]

## 2015-10-05 ENCOUNTER — Ambulatory Visit: Payer: Federal, State, Local not specified - PPO

## 2015-10-05 ENCOUNTER — Telehealth: Payer: Self-pay | Admitting: Radiation Oncology

## 2015-10-05 NOTE — Telephone Encounter (Signed)
I called to let the patient know his [facility] testing was normal, his wife answered and I offered referral to medical oncology again but at this time they will hold off on any additional intervention, and focus on hospice.

## 2015-10-06 ENCOUNTER — Ambulatory Visit: Payer: Federal, State, Local not specified - PPO | Admitting: Radiation Oncology

## 2015-10-21 NOTE — Progress Notes (Signed)
  Radiation Oncology         [phone] ________________________________  Name: [name] MRN: [id number]  Date: 09/29/2015  DOB: 1961/02/28  End of Treatment Note   ICD-9-CM ICD-10-CM    1. 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 Left femoral neck lytic metastasis (HCC) 198.5 C79.51     DIAGNOSIS: Bladder cancer with spine metastases     Indication for treatment:  Palliative       Radiation treatment dates:   09/19/2015-09/29/2015  Site/dose:    1. The lumbar metastases was treated to 17 Gy in 9 fractions of 3 Gy per fraction. 2. The left femur was treated to 27 Gy in 9 fractions of 3 Gy per fraction.  Beams/energy:    1. AP/PA / 15X 2. 3D-Conformal / 15X  Narrative: The patient tolerated radiation treatment relatively poorly. The patient experienced weakness, fatigue, constipation, along with lower back pain and left leg pain 7 on a scale 0-10 towards the end of treatment.  Plan: The patient has completed radiation treatment. The patient will return to radiation oncology clinic for routine followup in one month. I advised him to call or return sooner if he has any questions or concerns related to his recovery or treatment. ________________________________  Sheral Apley. Tammi Klippel, M.D.  This document serves as a record of services personally performed by Tyler Pita, MD. It was created on his behalf by Arlyce Harman, a trained medical scribe. The creation of this record is based on the scribe's personal observations and the provider's statements to them. This document has been checked and approved by the attending provider.

## 2015-10-31 DEATH — deceased

## 2015-11-03 ENCOUNTER — Ambulatory Visit: Payer: Self-pay | Admitting: Radiation Oncology

## 2015-11-15 ENCOUNTER — Ambulatory Visit: Payer: Federal, State, Local not specified - PPO | Admitting: Urology

## 2015-12-16 ENCOUNTER — Encounter: Payer: Self-pay | Admitting: Radiation Therapy

## 2015-12-16 NOTE — Progress Notes (Signed)
Pt expired on 2015-10-15, here is a clip from his obituary

## 2018-03-23 IMAGING — CT CT CHEST W/ CM
2 of 7 series · 15 of 46 positions shown, 17 images · IV contrast (OMNIPAQUE)
Comparison: 04/16/2013

CLINICAL DATA: Bladder cancer

EXAM:
CT CHEST, ABDOMEN, AND PELVIS WITH CONTRAST
TECHNIQUE: Multidetector CT imaging of the chest, abdomen and pelvis was
performed following the standard protocol during bolus
administration of intravenous contrast.
CONTRAST:  125mL [id number]-[id number] IOPAMIDOL ([id number]-B88) INJECTION 61%

[Series 2: post (supine) · axial · 0.90mm/px · z∈[-731,-121]mm · 12 of 144 slices shown, 14 images]
[im 11/144  soft-tissue]
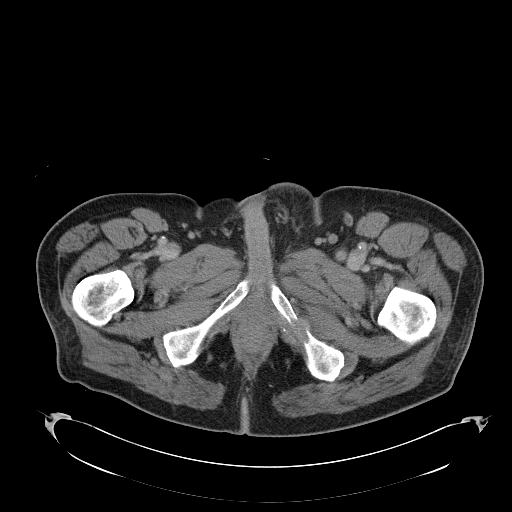
[im 11/144  bone]
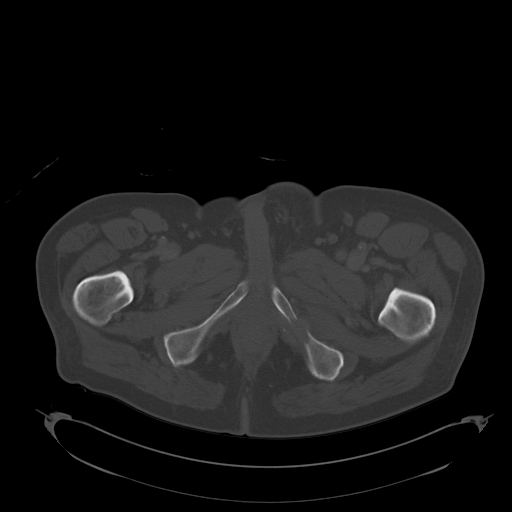
[im 21/144  soft-tissue]
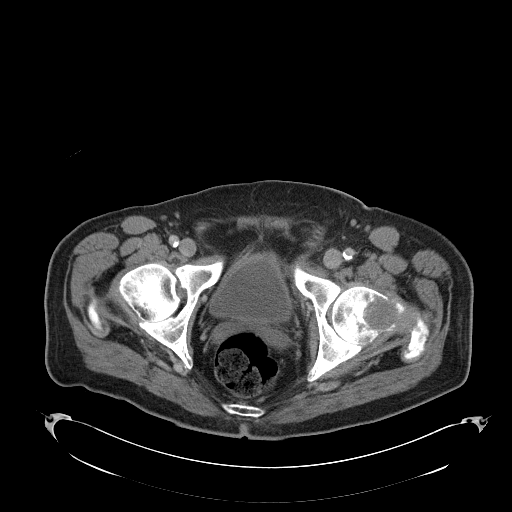
[im 31/144  soft-tissue]
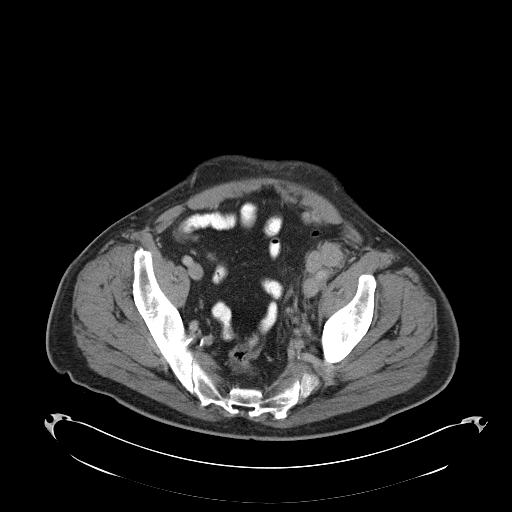
[im 41/144  soft-tissue]
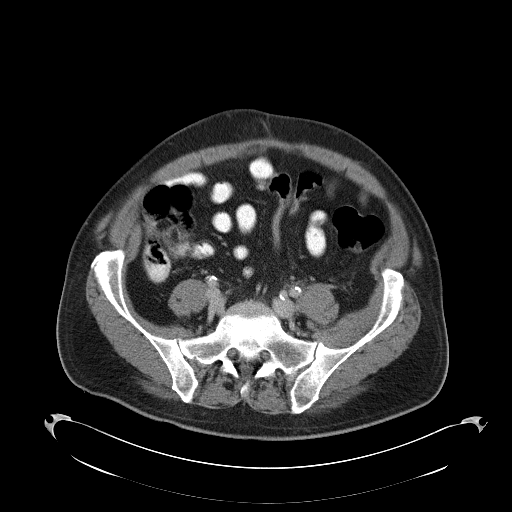
[im 52/144  soft-tissue]
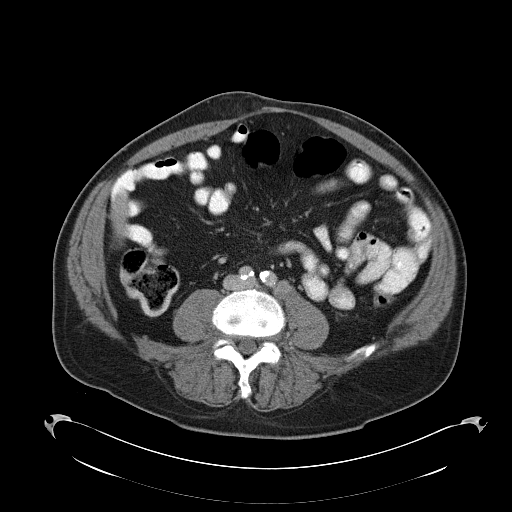
[im 62/144  soft-tissue]
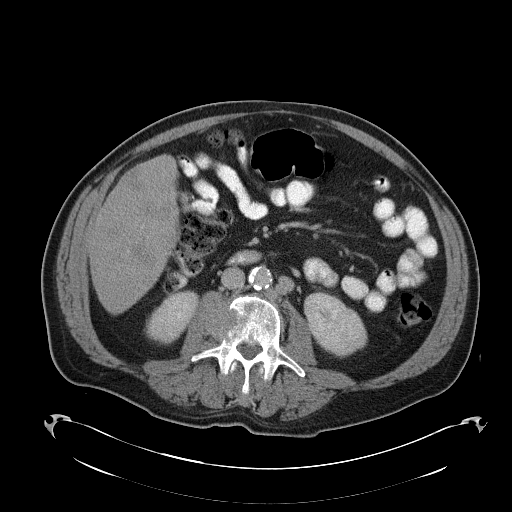
[im 82/144  soft-tissue]
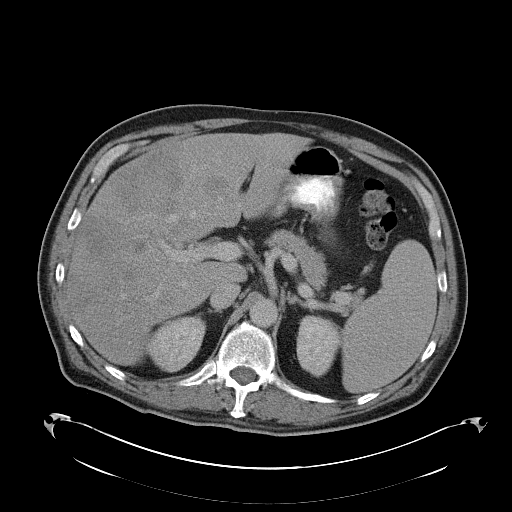
[im 92/144  soft-tissue]
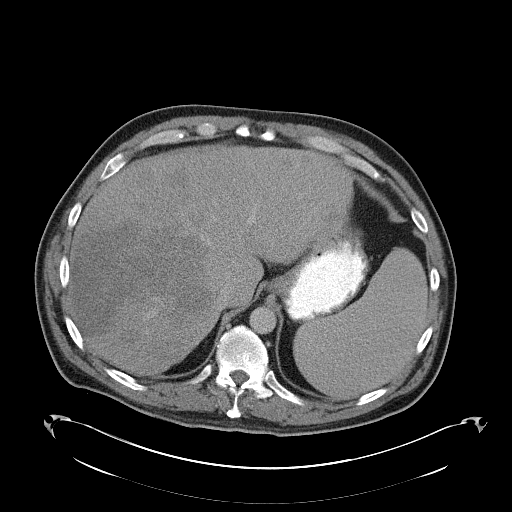
[im 103/144  soft-tissue]
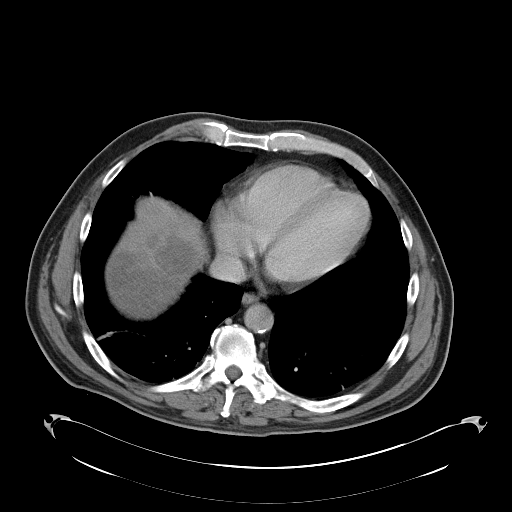
[im 103/144  bone]
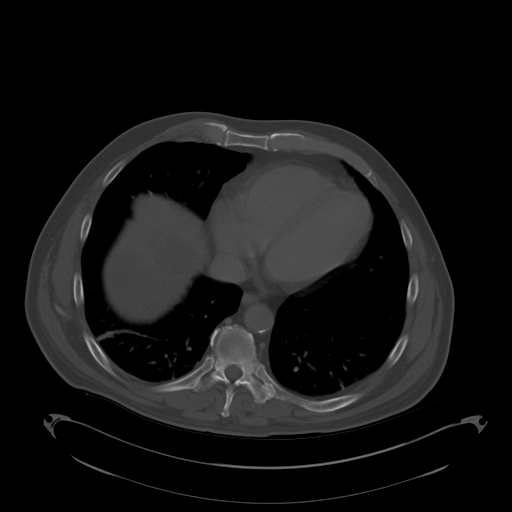
[im 113/144  soft-tissue]
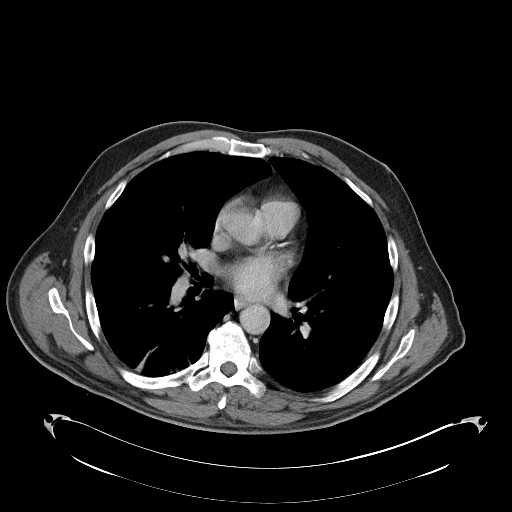
[im 123/144  soft-tissue]
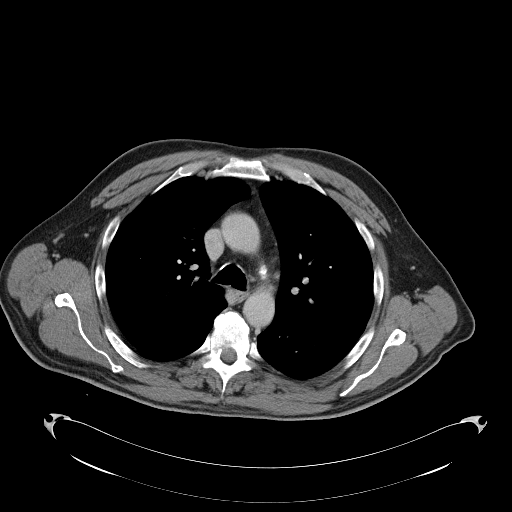
[im 133/144  soft-tissue]
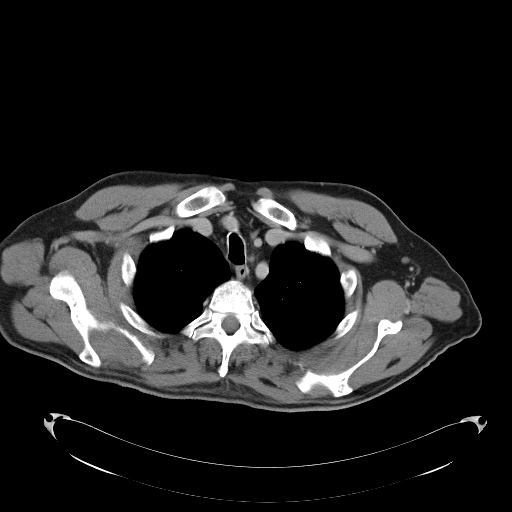

[Series 602: <mpr thick range> · coronal · 1.40mm/px · 3 of 109 slices shown]
[im 28/109  soft-tissue]
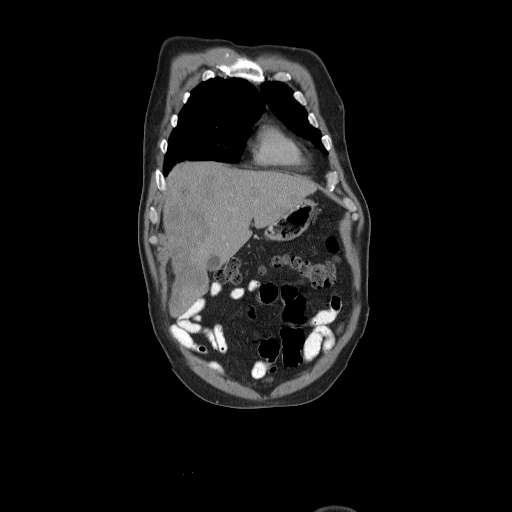
[im 55/109  soft-tissue]
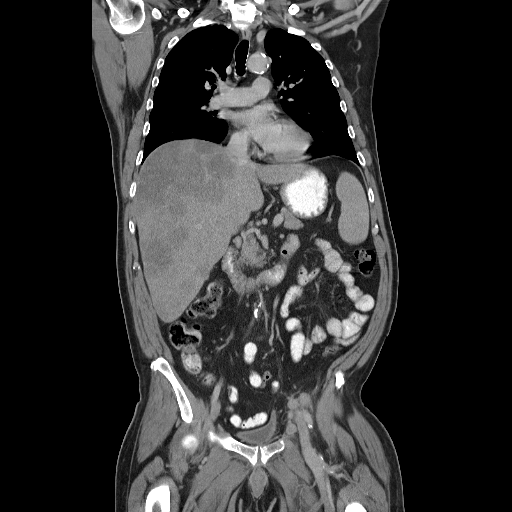
[im 82/109  soft-tissue]
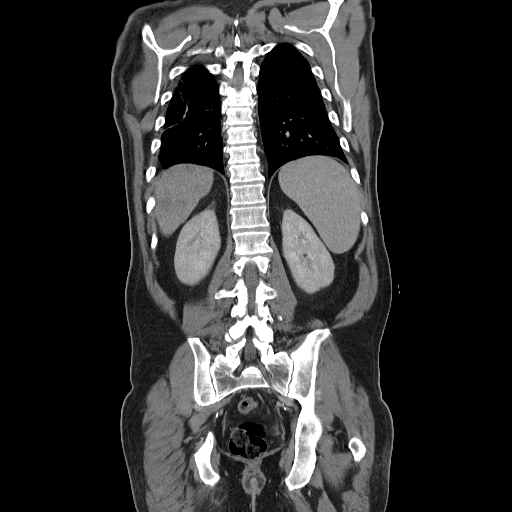

[15 of 46 positions shown; findings below may reference images not displayed]

FINDINGS: CT CHEST

No abnormal mediastinal adenopathy. Atherosclerotic calcifications
are noted. Mild LAD territory coronary artery calcification. Mild
aortic valve calcification.

No pneumothorax.  No pleural effusion

Dependent subsegmental atelectasis.  No evidence of lung nodule.

There is a destructive lesion in the posterior aspect of T5 which
breaks through the posterior cortex. Epidural tumor on the right
side is present. There is also a lytic bone lesion within the
posterior T7 vertebral body. Common T8 posterior vertebral body, T10
vertebral body, and T11 vertebral body.

CT ABDOMEN AND PELVIS

The right lobe of the liver is diffusely infiltrated with
heterogeneous mass effect. This occupies an area 19.5 x 13.2 cm on
image 57. The left lobe of the liver is relatively spared. There is
a 2.4 cm lesion in the medial segment of the left lobe on image 62.
There is also a smaller ill-defined lesion in the lateral segment of
the left lobe on image 55. Nearly the entire right lobe is involved.

Spleen is mildly enlarged without obvious focal mass

Kidneys pancreas, and adrenal glands are within normal limits.

Cholelithiasis.

Abnormal retroperitoneal adenopathy. 15 mm short axis diameter left
para-aortic lymph node on image 63. Other adjacent smaller lymph
nodes are noted. Abnormal left external iliac adenopathy. 1.9 cm
left external iliac node on image 114. Adjacent 13 mm lymph node. 15
mm left obturator node on image 121.

Atherosclerotic calcifications involve the aorta, iliac arteries,
and visceral vasculature.

No free-fluid.

Ventral hernia to the right of midline in the anterior abdomen
contains adipose tissue.

Widespread metastatic disease involves the lumbar vertebral bodies
as noted on a recent MRI. Large lesion and invades the right side of
L3 and right psoas muscle. There is a lesion in the right side of
the L5 vertebral body extending into the pedicle. This does cause
cortical breakthrough. There is a destructive lesion in the left
femoral neck the anterior cortex of the femoral neck is intact. The
superior cortex has been nearly completely destroyed. There is a
destructive lesion in the left inferior pubic ramus. There is also a
lesion in the right pubic body and left pubic body
IMPRESSION: Other than bony disease, there is no evidence of metastatic disease
in the thorax.

Metastatic disease in the thoracic spine is seen at multiple levels.
At T5, there is a posterior vertebral body lesion with cortical
breakthrough and epidural tumor

Extensive metastatic disease throughout the liver. Nearly the entire
right lobe is occupied by tumor

Abnormal left para-aortic and left iliac adenopathy

Widespread metastatic disease in the lumbar spine, pelvis, and left
femoral neck as described.

Cholelithiasis.
